# Patient Record
Sex: Male | Born: 1960 | Race: Black or African American | Hispanic: No | Marital: Married | Smoking: Never smoker
Health system: Southern US, Community
[De-identification: ages and names within clinical notes are randomized; demographics above are authoritative.]

## PROBLEM LIST (undated history)

## (undated) DIAGNOSIS — E1069 Type 1 diabetes mellitus with other specified complication: Secondary | ICD-10-CM

## (undated) DIAGNOSIS — E785 Hyperlipidemia, unspecified: Secondary | ICD-10-CM

## (undated) DIAGNOSIS — I6350 Cerebral infarction due to unspecified occlusion or stenosis of unspecified cerebral artery: Secondary | ICD-10-CM

## (undated) DIAGNOSIS — G4733 Obstructive sleep apnea (adult) (pediatric): Secondary | ICD-10-CM

## (undated) DIAGNOSIS — E119 Type 2 diabetes mellitus without complications: Secondary | ICD-10-CM

## (undated) DIAGNOSIS — D8689 Sarcoidosis of other sites: Secondary | ICD-10-CM

## (undated) DIAGNOSIS — F4321 Adjustment disorder with depressed mood: Secondary | ICD-10-CM

## (undated) DIAGNOSIS — I10 Essential (primary) hypertension: Secondary | ICD-10-CM

## (undated) DIAGNOSIS — I639 Cerebral infarction, unspecified: Secondary | ICD-10-CM

## (undated) DIAGNOSIS — L409 Psoriasis, unspecified: Secondary | ICD-10-CM

## (undated) DIAGNOSIS — Z86718 Personal history of other venous thrombosis and embolism: Secondary | ICD-10-CM

## (undated) DIAGNOSIS — D869 Sarcoidosis, unspecified: Secondary | ICD-10-CM

## (undated) HISTORY — DX: Obstructive sleep apnea (adult) (pediatric): G47.33

## (undated) HISTORY — DX: Cerebral infarction due to unspecified occlusion or stenosis of unspecified cerebral artery: I63.50

## (undated) HISTORY — DX: Personal history of other venous thrombosis and embolism: Z86.718

## (undated) HISTORY — DX: Psoriasis, unspecified: L40.9

---

## 2004-04-27 DIAGNOSIS — Z86718 Personal history of other venous thrombosis and embolism: Secondary | ICD-10-CM | POA: Insufficient documentation

## 2004-04-27 HISTORY — PX: VENTRICULAR ATRIAL SHUNT: SHX2657

## 2004-04-27 HISTORY — DX: Personal history of other venous thrombosis and embolism: Z86.718

## 2016-02-26 DIAGNOSIS — I639 Cerebral infarction, unspecified: Secondary | ICD-10-CM

## 2016-02-26 HISTORY — DX: Cerebral infarction, unspecified: I63.9

## 2017-07-13 DIAGNOSIS — E7801 Familial hypercholesterolemia: Secondary | ICD-10-CM | POA: Diagnosis not present

## 2017-07-13 DIAGNOSIS — E039 Hypothyroidism, unspecified: Secondary | ICD-10-CM | POA: Diagnosis not present

## 2017-07-13 DIAGNOSIS — E139 Other specified diabetes mellitus without complications: Secondary | ICD-10-CM | POA: Diagnosis not present

## 2017-07-13 DIAGNOSIS — Z0001 Encounter for general adult medical examination with abnormal findings: Secondary | ICD-10-CM | POA: Diagnosis not present

## 2017-07-13 DIAGNOSIS — I10 Essential (primary) hypertension: Secondary | ICD-10-CM | POA: Diagnosis not present

## 2018-03-31 ENCOUNTER — Inpatient Hospital Stay (HOSPITAL_COMMUNITY)
Admission: EM | Admit: 2018-03-31 | Discharge: 2018-04-02 | DRG: 065 | Disposition: A | Discharge status: Home / Self Care | Payer: Federal, State, Local not specified - PPO | Attending: Internal Medicine | Admitting: Internal Medicine

## 2018-03-31 ENCOUNTER — Emergency Department (HOSPITAL_COMMUNITY): Payer: Federal, State, Local not specified - PPO

## 2018-03-31 ENCOUNTER — Other Ambulatory Visit: Payer: Self-pay

## 2018-03-31 ENCOUNTER — Encounter (HOSPITAL_COMMUNITY): Payer: Self-pay

## 2018-03-31 DIAGNOSIS — R4781 Slurred speech: Secondary | ICD-10-CM | POA: Diagnosis present

## 2018-03-31 DIAGNOSIS — E1159 Type 2 diabetes mellitus with other circulatory complications: Secondary | ICD-10-CM | POA: Diagnosis present

## 2018-03-31 DIAGNOSIS — Z7982 Long term (current) use of aspirin: Secondary | ICD-10-CM

## 2018-03-31 DIAGNOSIS — D869 Sarcoidosis, unspecified: Secondary | ICD-10-CM | POA: Diagnosis not present

## 2018-03-31 DIAGNOSIS — I672 Cerebral atherosclerosis: Secondary | ICD-10-CM | POA: Diagnosis not present

## 2018-03-31 DIAGNOSIS — Z8673 Personal history of transient ischemic attack (TIA), and cerebral infarction without residual deficits: Secondary | ICD-10-CM | POA: Diagnosis present

## 2018-03-31 DIAGNOSIS — E108 Type 1 diabetes mellitus with unspecified complications: Secondary | ICD-10-CM | POA: Diagnosis present

## 2018-03-31 DIAGNOSIS — Z794 Long term (current) use of insulin: Secondary | ICD-10-CM | POA: Diagnosis not present

## 2018-03-31 DIAGNOSIS — I69351 Hemiplegia and hemiparesis following cerebral infarction affecting right dominant side: Secondary | ICD-10-CM

## 2018-03-31 DIAGNOSIS — R4701 Aphasia: Secondary | ICD-10-CM | POA: Diagnosis not present

## 2018-03-31 DIAGNOSIS — Z79899 Other long term (current) drug therapy: Secondary | ICD-10-CM

## 2018-03-31 DIAGNOSIS — D8689 Sarcoidosis of other sites: Secondary | ICD-10-CM | POA: Diagnosis not present

## 2018-03-31 DIAGNOSIS — I639 Cerebral infarction, unspecified: Secondary | ICD-10-CM

## 2018-03-31 DIAGNOSIS — R29810 Facial weakness: Secondary | ICD-10-CM | POA: Diagnosis not present

## 2018-03-31 DIAGNOSIS — E1069 Type 1 diabetes mellitus with other specified complication: Secondary | ICD-10-CM | POA: Diagnosis present

## 2018-03-31 DIAGNOSIS — I6330 Cerebral infarction due to thrombosis of unspecified cerebral artery: Secondary | ICD-10-CM | POA: Diagnosis not present

## 2018-03-31 DIAGNOSIS — I6381 Other cerebral infarction due to occlusion or stenosis of small artery: Secondary | ICD-10-CM | POA: Diagnosis not present

## 2018-03-31 DIAGNOSIS — I152 Hypertension secondary to endocrine disorders: Secondary | ICD-10-CM | POA: Diagnosis present

## 2018-03-31 DIAGNOSIS — F4321 Adjustment disorder with depressed mood: Secondary | ICD-10-CM | POA: Diagnosis not present

## 2018-03-31 DIAGNOSIS — Z8249 Family history of ischemic heart disease and other diseases of the circulatory system: Secondary | ICD-10-CM

## 2018-03-31 DIAGNOSIS — E785 Hyperlipidemia, unspecified: Secondary | ICD-10-CM | POA: Diagnosis present

## 2018-03-31 DIAGNOSIS — R471 Dysarthria and anarthria: Secondary | ICD-10-CM | POA: Diagnosis present

## 2018-03-31 DIAGNOSIS — R29702 NIHSS score 2: Secondary | ICD-10-CM | POA: Diagnosis not present

## 2018-03-31 DIAGNOSIS — Z982 Presence of cerebrospinal fluid drainage device: Secondary | ICD-10-CM | POA: Diagnosis not present

## 2018-03-31 DIAGNOSIS — E1051 Type 1 diabetes mellitus with diabetic peripheral angiopathy without gangrene: Secondary | ICD-10-CM | POA: Diagnosis present

## 2018-03-31 DIAGNOSIS — I6300 Cerebral infarction due to thrombosis of unspecified precerebral artery: Secondary | ICD-10-CM | POA: Diagnosis not present

## 2018-03-31 DIAGNOSIS — I10 Essential (primary) hypertension: Secondary | ICD-10-CM | POA: Diagnosis present

## 2018-03-31 DIAGNOSIS — I6389 Other cerebral infarction: Secondary | ICD-10-CM | POA: Diagnosis not present

## 2018-03-31 HISTORY — DX: Adjustment disorder with depressed mood: F43.21

## 2018-03-31 HISTORY — DX: Cerebral infarction, unspecified: I63.9

## 2018-03-31 HISTORY — DX: Hyperlipidemia, unspecified: E78.5

## 2018-03-31 HISTORY — DX: Sarcoidosis, unspecified: D86.9

## 2018-03-31 HISTORY — DX: Type 1 diabetes mellitus with other specified complication: E10.69

## 2018-03-31 HISTORY — DX: Essential (primary) hypertension: I10

## 2018-03-31 HISTORY — DX: Type 2 diabetes mellitus without complications: E11.9

## 2018-03-31 HISTORY — DX: Sarcoidosis of other sites: D86.89

## 2018-03-31 LAB — COMPREHENSIVE METABOLIC PANEL
ALT: 24 U/L (ref 0–44)
AST: 24 U/L (ref 15–41)
Albumin: 3.5 g/dL (ref 3.5–5.0)
Alkaline Phosphatase: 70 U/L (ref 38–126)
Anion gap: 9 (ref 5–15)
BUN: 21 mg/dL — AB (ref 6–20)
CO2: 25 mmol/L (ref 22–32)
Calcium: 9.2 mg/dL (ref 8.9–10.3)
Chloride: 107 mmol/L (ref 98–111)
Creatinine, Ser: 1.11 mg/dL (ref 0.61–1.24)
GFR calc Af Amer: >60 mL/min (ref >60)
GFR calc non Af Amer: >60 mL/min (ref >60)
Glucose, Bld: 77 mg/dL (ref 70–99)
Potassium: 4.1 mmol/L (ref 3.5–5.1)
Sodium: 141 mmol/L (ref 135–145)
Total Bilirubin: 0.3 mg/dL (ref 0.3–1.2)
Total Protein: 6.9 g/dL (ref 6.5–8.1)

## 2018-03-31 LAB — DIFFERENTIAL
ABS IMMATURE GRANULOCYTES: 0.01 10*3/uL (ref 0.00–0.07)
Basophils Absolute: 0 10*3/uL (ref 0.0–0.1)
Basophils Relative: 1 %
Eosinophils Absolute: 0.3 10*3/uL (ref 0.0–0.5)
Eosinophils Relative: 4 %
Immature Granulocytes: 0 %
Lymphocytes Relative: 37 %
Lymphs Abs: 2.4 10*3/uL (ref 0.7–4.0)
Monocytes Absolute: 0.6 10*3/uL (ref 0.1–1.0)
Monocytes Relative: 8 %
Neutro Abs: 3.4 10*3/uL (ref 1.7–7.7)
Neutrophils Relative %: 50 %

## 2018-03-31 LAB — I-STAT CHEM 8, ED
BUN: 23 mg/dL — ABNORMAL HIGH (ref 6–20)
CHLORIDE: 107 mmol/L (ref 98–111)
Calcium, Ion: 1.19 mmol/L (ref 1.15–1.40)
Creatinine, Ser: 1 mg/dL (ref 0.61–1.24)
Glucose, Bld: 69 mg/dL — ABNORMAL LOW (ref 70–99)
HCT: 43 % (ref 39.0–52.0)
Hemoglobin: 14.6 g/dL (ref 13.0–17.0)
Potassium: 4.1 mmol/L (ref 3.5–5.1)
Sodium: 141 mmol/L (ref 135–145)
TCO2: 27 mmol/L (ref 22–32)

## 2018-03-31 LAB — I-STAT TROPONIN, ED: Troponin i, poc: 0 ng/mL (ref 0.00–0.08)

## 2018-03-31 LAB — PROTIME-INR
INR: 1.18
PROTHROMBIN TIME: 14.9 s (ref 11.4–15.2)

## 2018-03-31 LAB — CBC
HCT: 43.1 % (ref 39.0–52.0)
Hemoglobin: 13.9 g/dL (ref 13.0–17.0)
MCH: 29.6 pg (ref 26.0–34.0)
MCHC: 32.3 g/dL (ref 30.0–36.0)
MCV: 91.7 fL (ref 80.0–100.0)
Platelets: 193 10*3/uL (ref 150–400)
RBC: 4.7 MIL/uL (ref 4.22–5.81)
RDW: 13.3 % (ref 11.5–15.5)
WBC: 6.7 10*3/uL (ref 4.0–10.5)
nRBC: 0 % (ref 0.0–0.2)

## 2018-03-31 LAB — APTT: aPTT: 25 seconds (ref 24–36)

## 2018-03-31 LAB — CBG MONITORING, ED
Glucose-Capillary: 57 mg/dL — ABNORMAL LOW (ref 70–99)
Glucose-Capillary: 154 mg/dL — ABNORMAL HIGH (ref 70–99)

## 2018-03-31 NOTE — ED Provider Notes (Signed)
[facility] EMERGENCY DEPARTMENT Provider Note   CSN: [id number] Arrival date & time: 03/31/18  1957     History   Chief Complaint Chief Complaint  Patient presents with  . Aphasia    since 03/30/18    HPI Daryn Hicks is a 57 y.o. male.  HPI  57 year old male with history of stroke and diabetes comes in with chief complaint of slurred speech. Patient has right-sided weakness as residual deficits from his stroke. He reports that yesterday he started having some difficulty with his fluency.  Today his family started noticing that he was slurring.  Patient was last normal yesterday around 3 PM.  Patient denies any new focal numbness, weakness, dizziness or vision change.  With his prior stroke he did not have any slurring. Review of system is negative for any UTI-like symptoms.  Patient also denies any recent fevers, chills, cough, flulike symptoms.  Past Medical History:  Diagnosis Date  . Diabetes mellitus without complication (HCC)   . Stroke [facility])     There are no active problems to display for this patient.   Past Surgical History:  Procedure Laterality Date  . VENTRICULAR ATRIAL SHUNT Right 2006   Procedure done at [facility] Medications    Prior to Admission medications   Medication Sig Start Date End Date Taking? Authorizing Provider  amLODipine (NORVASC) 10 MG tablet Take 10 mg by mouth daily. 01/20/18  Yes [provider]  aspirin EC 325 MG tablet Take 325 mg by mouth daily. 02/19/18  Yes [provider]  atenolol (TENORMIN) 100 MG tablet Take 100 mg by mouth daily.   Yes [provider]  atorvastatin (LIPITOR) 80 MG tablet Take 80 mg by mouth daily. 03/06/18  Yes [provider]  carvedilol (COREG) 3.125 MG tablet Take 3.125 mg by mouth 2 (two) times daily. 02/20/18  Yes [provider]  Cholecalciferol (VITAMIN D-3 PO) Take 1 capsule by mouth daily.   Yes  [provider]  Insulin Glargine (LANTUS SOLOSTAR) 100 UNIT/ML Solostar Pen Inject 7-8 Units into the skin every 12 (twelve) hours.    Yes [provider]  losartan (COZAAR) 100 MG tablet Take 100 mg by mouth daily.   Yes [provider]  multivitamin (ONE-A-DAY MEN'S) TABS tablet Take 1 tablet by mouth daily.   Yes [provider]  NOVOLOG FLEXPEN 100 UNIT/ML FlexPen Inject 8 Units into the skin 3 (three) times daily with meals. 03/22/18  Yes [provider]  sertraline (ZOLOFT) 100 MG tablet Take 100 mg by mouth daily.   Yes [provider]    Family History History reviewed. No pertinent family history.  Social History Social History   Tobacco Use  . Smoking status: Never Smoker  . Smokeless tobacco: Never Used  Substance Use Topics  . Alcohol use: Never    Frequency: Never  . Drug use: Never     Allergies   Patient has no known allergies.   Review of Systems Review of Systems  Constitutional: Positive for activity change. Negative for fever.  Gastrointestinal: Negative for nausea and vomiting.  Neurological: Positive for speech difficulty. Negative for dizziness, syncope, weakness and headaches.  Hematological: Does not bruise/bleed easily.  All other systems reviewed and are negative.    Physical Exam Updated Vital Signs BP (!) 153/82   Pulse 62   Temp 98.2 F (36.8 C) (Oral)   Resp 16   SpO2  99%   Physical Exam  Constitutional: He is oriented to person, place, and time. He appears well-developed.  HENT:  Head: Atraumatic.  Neck: Neck supple.  Cardiovascular: Normal rate.  Pulmonary/Chest: Effort normal.  Abdominal: Soft.  Neurological: He is alert and oriented to person, place, and time.  Patient has dysarthria  Cerebellar exam is normal (finger to nose) Sensory exam normal for bilateral upper and lower extremities - and patient is able to discriminate between sharp and dull. Motor exam is 4+/5  for bilateral upper and lower extremity, however right side does appear to be weaker compared to the contralateral side    Skin: Skin is warm.  Nursing note and vitals reviewed.    ED Treatments / Results  Labs (all labs ordered are listed, but only abnormal results are displayed) Labs Reviewed  COMPREHENSIVE METABOLIC PANEL - Abnormal; Notable for the following components:      Result Value   BUN 21 (*)    All other components within normal limits  CBG MONITORING, ED - Abnormal; Notable for the following components:   Glucose-Capillary 57 (*)    All other components within normal limits  I-STAT CHEM 8, ED - Abnormal; Notable for the following components:   BUN 23 (*)    Glucose, Bld 69 (*)    All other components within normal limits  PROTIME-INR  APTT  CBC  DIFFERENTIAL  I-STAT TROPONIN, ED  CBG MONITORING, ED    EKG EKG Interpretation  Date/Time:  Thursday March 31 2018 20:42:12 EST Ventricular Rate:  62 PR Interval:    QRS Duration: 80 QT Interval:  416 QTC Calculation: 423 R Axis:   131 Text Interpretation:  Right and left arm electrode reversal, interpretation assumes no reversal Sinus or ectopic atrial rhythm Probable lateral infarct, old No acute changes No old tracing to compare Confirmed by Derwood Kaplan [phone]) on 03/31/2018 9:48:43 PM   Radiology Ct Head Wo Contrast  Result Date: 03/31/2018 CLINICAL DATA:  Slurred speech beginning yesterday. History of stroke. EXAM: CT HEAD WITHOUT CONTRAST TECHNIQUE: Contiguous axial images were obtained from the base of the skull through the vertex without intravenous contrast. COMPARISON:  None. FINDINGS: Brain: Right parietal approach ventriculostomy catheter crosses midline into the expected body of the left lateral ventricle. No hydrocephalus. Chronic minimal small vessel ischemic disease of periventricular white matter. No intra-axial mass nor extra-axial fluid. Midline fourth ventricle basal cisterns. The  brainstem and cerebellum appear nonacute. Vascular: No hyperdense vessel sign. Skull: Negative for fracture or focal lesion. Sinuses/Orbits: No acute finding. Other: None. IMPRESSION: 1. Right parietal approach ventriculostomy catheter crosses midline into the body of the left lateral ventricle. No hydrocephalus. 2. Chronic minimal small vessel ischemic disease of periventricular white matter. 3. No acute intracranial abnormality. Electronically Signed   By: Tollie Eth M.D.   On: 03/31/2018 20:27    Procedures Procedures (including critical care time)  Medications Ordered in ED Medications - No data to display   Initial Impression / Assessment and Plan / ED Course  I have reviewed the triage vital signs and the nursing notes.  Pertinent labs & imaging results that were available during my care of the patient were reviewed by me and considered in my medical decision making (see chart for details).     57 year old male comes in with chief complaint of slurred speech.  He has history of diabetes and stroke. It appears that he was last normal yesterday afternoon.  On exam we do not have any focal  findings besides dysarthria.  Patient is well outside any treatment window.  I spoke with Dr. Laurence SlateAroor, neurology with concerns for subacute stroke.  CT scan of the brain is not showing any acute findings.  Dr. wants an MRI brain done, and for neurology to be consulted if patient has a stroke.  If MRI is negative then he would like patient to be discharged if there are no other medical concerns.  Our labs have not shown any significant metabolic abnormalities.  Final Clinical Impressions(s) / ED Diagnoses   Final diagnoses:  Dysarthria    ED Discharge Orders    None       Derwood KaplanNanavati, Icyss Skog, MD 03/31/18 2234

## 2018-03-31 NOTE — ED Triage Notes (Signed)
Pt here for slurred speech that began 03/30/18 in the early afternoon.  Slurred speech became progressively worse today. Pt has hx of stroke with right sided deficits.  Right arm and leg are weaker at baseline.  No other neuro deficits besides the slurred speech.  Hx of DM type 1. A&Ox4.

## 2018-03-31 NOTE — ED Notes (Signed)
Pt transported to MRI 

## 2018-04-01 ENCOUNTER — Encounter (HOSPITAL_COMMUNITY): Payer: Self-pay | Admitting: Internal Medicine

## 2018-04-01 ENCOUNTER — Other Ambulatory Visit (HOSPITAL_COMMUNITY): Payer: Federal, State, Local not specified - PPO

## 2018-04-01 ENCOUNTER — Inpatient Hospital Stay (HOSPITAL_COMMUNITY): Payer: Federal, State, Local not specified - PPO

## 2018-04-01 ENCOUNTER — Encounter (HOSPITAL_COMMUNITY): Payer: Federal, State, Local not specified - PPO

## 2018-04-01 DIAGNOSIS — I6389 Other cerebral infarction: Secondary | ICD-10-CM | POA: Diagnosis not present

## 2018-04-01 DIAGNOSIS — I63312 Cerebral infarction due to thrombosis of left middle cerebral artery: Secondary | ICD-10-CM

## 2018-04-01 DIAGNOSIS — Z794 Long term (current) use of insulin: Secondary | ICD-10-CM | POA: Diagnosis not present

## 2018-04-01 DIAGNOSIS — I6330 Cerebral infarction due to thrombosis of unspecified cerebral artery: Secondary | ICD-10-CM | POA: Diagnosis not present

## 2018-04-01 DIAGNOSIS — R471 Dysarthria and anarthria: Secondary | ICD-10-CM

## 2018-04-01 DIAGNOSIS — R4781 Slurred speech: Secondary | ICD-10-CM | POA: Diagnosis not present

## 2018-04-01 DIAGNOSIS — Z8673 Personal history of transient ischemic attack (TIA), and cerebral infarction without residual deficits: Secondary | ICD-10-CM

## 2018-04-01 DIAGNOSIS — I639 Cerebral infarction, unspecified: Secondary | ICD-10-CM | POA: Diagnosis present

## 2018-04-01 DIAGNOSIS — F4321 Adjustment disorder with depressed mood: Secondary | ICD-10-CM | POA: Diagnosis present

## 2018-04-01 DIAGNOSIS — E785 Hyperlipidemia, unspecified: Secondary | ICD-10-CM | POA: Diagnosis present

## 2018-04-01 DIAGNOSIS — D869 Sarcoidosis, unspecified: Secondary | ICD-10-CM | POA: Diagnosis not present

## 2018-04-01 DIAGNOSIS — R4701 Aphasia: Secondary | ICD-10-CM | POA: Diagnosis not present

## 2018-04-01 DIAGNOSIS — E1069 Type 1 diabetes mellitus with other specified complication: Secondary | ICD-10-CM | POA: Diagnosis present

## 2018-04-01 DIAGNOSIS — E1051 Type 1 diabetes mellitus with diabetic peripheral angiopathy without gangrene: Secondary | ICD-10-CM | POA: Diagnosis present

## 2018-04-01 DIAGNOSIS — Z8249 Family history of ischemic heart disease and other diseases of the circulatory system: Secondary | ICD-10-CM | POA: Diagnosis not present

## 2018-04-01 DIAGNOSIS — I6381 Other cerebral infarction due to occlusion or stenosis of small artery: Secondary | ICD-10-CM | POA: Diagnosis not present

## 2018-04-01 DIAGNOSIS — Z79899 Other long term (current) drug therapy: Secondary | ICD-10-CM | POA: Diagnosis not present

## 2018-04-01 DIAGNOSIS — I6621 Occlusion and stenosis of right posterior cerebral artery: Secondary | ICD-10-CM | POA: Diagnosis not present

## 2018-04-01 DIAGNOSIS — E108 Type 1 diabetes mellitus with unspecified complications: Secondary | ICD-10-CM | POA: Diagnosis present

## 2018-04-01 DIAGNOSIS — I10 Essential (primary) hypertension: Secondary | ICD-10-CM

## 2018-04-01 DIAGNOSIS — I152 Hypertension secondary to endocrine disorders: Secondary | ICD-10-CM | POA: Diagnosis present

## 2018-04-01 DIAGNOSIS — D8689 Sarcoidosis of other sites: Secondary | ICD-10-CM

## 2018-04-01 DIAGNOSIS — I69351 Hemiplegia and hemiparesis following cerebral infarction affecting right dominant side: Secondary | ICD-10-CM | POA: Diagnosis not present

## 2018-04-01 DIAGNOSIS — R29702 NIHSS score 2: Secondary | ICD-10-CM | POA: Diagnosis present

## 2018-04-01 DIAGNOSIS — Z982 Presence of cerebrospinal fluid drainage device: Secondary | ICD-10-CM | POA: Diagnosis not present

## 2018-04-01 DIAGNOSIS — I6300 Cerebral infarction due to thrombosis of unspecified precerebral artery: Secondary | ICD-10-CM | POA: Diagnosis not present

## 2018-04-01 DIAGNOSIS — R29810 Facial weakness: Secondary | ICD-10-CM | POA: Diagnosis present

## 2018-04-01 DIAGNOSIS — Z7982 Long term (current) use of aspirin: Secondary | ICD-10-CM | POA: Diagnosis not present

## 2018-04-01 DIAGNOSIS — E1159 Type 2 diabetes mellitus with other circulatory complications: Secondary | ICD-10-CM | POA: Diagnosis present

## 2018-04-01 DIAGNOSIS — I672 Cerebral atherosclerosis: Secondary | ICD-10-CM | POA: Diagnosis present

## 2018-04-01 HISTORY — DX: Type 1 diabetes mellitus with other specified complication: E10.69

## 2018-04-01 HISTORY — DX: Personal history of transient ischemic attack (TIA), and cerebral infarction without residual deficits: Z86.73

## 2018-04-01 HISTORY — DX: Adjustment disorder with depressed mood: F43.21

## 2018-04-01 HISTORY — DX: Sarcoidosis, unspecified: D86.9

## 2018-04-01 HISTORY — DX: Sarcoidosis of other sites: D86.89

## 2018-04-01 HISTORY — DX: Essential (primary) hypertension: I10

## 2018-04-01 LAB — URINALYSIS, COMPLETE (UACMP) WITH MICROSCOPIC
BACTERIA UA: NONE SEEN
Bilirubin Urine: NEGATIVE
Glucose, UA: NEGATIVE mg/dL
Hgb urine dipstick: NEGATIVE
Ketones, ur: NEGATIVE mg/dL
Leukocytes, UA: NEGATIVE
Nitrite: NEGATIVE
Protein, ur: NEGATIVE mg/dL
Specific Gravity, Urine: 1.008 (ref 1.005–1.030)
pH: 6 (ref 5.0–8.0)

## 2018-04-01 LAB — CBC
HCT: 41.4 % (ref 39.0–52.0)
Hemoglobin: 13.5 g/dL (ref 13.0–17.0)
MCH: 29.7 pg (ref 26.0–34.0)
MCHC: 32.6 g/dL (ref 30.0–36.0)
MCV: 91.2 fL (ref 80.0–100.0)
Platelets: 182 10*3/uL (ref 150–400)
RBC: 4.54 MIL/uL (ref 4.22–5.81)
RDW: 13.4 % (ref 11.5–15.5)
WBC: 6.3 10*3/uL (ref 4.0–10.5)
nRBC: 0 % (ref 0.0–0.2)

## 2018-04-01 LAB — RAPID URINE DRUG SCREEN, HOSP PERFORMED
Amphetamines: NOT DETECTED
Barbiturates: NOT DETECTED
Benzodiazepines: NOT DETECTED
Cocaine: NOT DETECTED
OPIATES: NOT DETECTED
Tetrahydrocannabinol: NOT DETECTED

## 2018-04-01 LAB — COMPREHENSIVE METABOLIC PANEL
ALT: 24 U/L (ref 0–44)
AST: 25 U/L (ref 15–41)
Albumin: 3.5 g/dL (ref 3.5–5.0)
Alkaline Phosphatase: 66 U/L (ref 38–126)
Anion gap: 8 (ref 5–15)
BUN: 16 mg/dL (ref 6–20)
CO2: 25 mmol/L (ref 22–32)
Calcium: 9.2 mg/dL (ref 8.9–10.3)
Chloride: 106 mmol/L (ref 98–111)
Creatinine, Ser: 1.09 mg/dL (ref 0.61–1.24)
GFR calc Af Amer: >60 mL/min (ref >60)
GFR calc non Af Amer: >60 mL/min (ref >60)
Glucose, Bld: 112 mg/dL — ABNORMAL HIGH (ref 70–99)
Potassium: 4 mmol/L (ref 3.5–5.1)
Sodium: 139 mmol/L (ref 135–145)
Total Bilirubin: 0.4 mg/dL (ref 0.3–1.2)
Total Protein: 6.7 g/dL (ref 6.5–8.1)

## 2018-04-01 LAB — LIPID PANEL
Cholesterol: 197 mg/dL (ref 0–200)
HDL: 86 mg/dL (ref >40)
LDL Cholesterol: 103 mg/dL — ABNORMAL HIGH (ref 0–99)
Total CHOL/HDL Ratio: 2.3 RATIO
Triglycerides: 38 mg/dL (ref <150)
VLDL: 8 mg/dL (ref 0–40)

## 2018-04-01 LAB — GLUCOSE, CAPILLARY
Glucose-Capillary: 209 mg/dL — ABNORMAL HIGH (ref 70–99)
Glucose-Capillary: 363 mg/dL — ABNORMAL HIGH (ref 70–99)
Glucose-Capillary: 322 mg/dL — ABNORMAL HIGH (ref 70–99)
Glucose-Capillary: 127 mg/dL — ABNORMAL HIGH (ref 70–99)
Glucose-Capillary: 260 mg/dL — ABNORMAL HIGH (ref 70–99)

## 2018-04-01 LAB — HEMOGLOBIN A1C
Hgb A1c MFr Bld: 6.4 % — ABNORMAL HIGH (ref 4.8–5.6)
Mean Plasma Glucose: 136.98 mg/dL

## 2018-04-01 LAB — HIV ANTIBODY (ROUTINE TESTING W REFLEX): HIV Screen 4th Generation wRfx: NONREACTIVE

## 2018-04-01 MED ORDER — ASPIRIN EC 81 MG PO TBEC
81.0000 mg | DELAYED_RELEASE_TABLET | Freq: Every day | ORAL | Status: DC
  Administered 2018-04-01 – 2018-04-02 (×2): 81 mg via ORAL
  Filled 2018-04-01 (×2): qty 1

## 2018-04-01 MED ORDER — LOSARTAN POTASSIUM 50 MG PO TABS
100.0000 mg | ORAL_TABLET | Freq: Every day | ORAL | Status: DC
  Administered 2018-04-02: 100 mg via ORAL
  Filled 2018-04-01: qty 2

## 2018-04-01 MED ORDER — INSULIN ASPART 100 UNIT/ML ~~LOC~~ SOLN
4.0000 [IU] | Freq: Once | SUBCUTANEOUS | Status: AC
  Administered 2018-04-01: 4 [IU] via SUBCUTANEOUS

## 2018-04-01 MED ORDER — INSULIN GLARGINE 100 UNIT/ML ~~LOC~~ SOLN
7.0000 [IU] | Freq: Every day | SUBCUTANEOUS | Status: DC
  Administered 2018-04-01: 7 [IU] via SUBCUTANEOUS
  Filled 2018-04-01 (×2): qty 0.07

## 2018-04-01 MED ORDER — ENOXAPARIN SODIUM 40 MG/0.4ML ~~LOC~~ SOLN
40.0000 mg | Freq: Every day | SUBCUTANEOUS | Status: DC
  Administered 2018-04-01 – 2018-04-02 (×2): 40 mg via SUBCUTANEOUS
  Filled 2018-04-01 (×2): qty 0.4

## 2018-04-01 MED ORDER — CLOPIDOGREL BISULFATE 75 MG PO TABS: 75.0000 mg | ORAL_TABLET | Freq: Every day | ORAL | Status: DC

## 2018-04-01 MED ORDER — SERTRALINE HCL 100 MG PO TABS
100.0000 mg | ORAL_TABLET | Freq: Every day | ORAL | Status: DC
  Administered 2018-04-01 – 2018-04-02 (×2): 100 mg via ORAL
  Filled 2018-04-01 (×2): qty 1

## 2018-04-01 MED ORDER — AMLODIPINE BESYLATE 10 MG PO TABS
10.0000 mg | ORAL_TABLET | Freq: Every day | ORAL | Status: DC
  Administered 2018-04-02: 10 mg via ORAL
  Filled 2018-04-01: qty 2

## 2018-04-01 MED ORDER — ONDANSETRON HCL 4 MG/2ML IJ SOLN: 4.0000 mg | Freq: Four times a day (QID) | INTRAMUSCULAR | Status: DC | PRN

## 2018-04-01 MED ORDER — ATORVASTATIN CALCIUM 80 MG PO TABS: 80.0000 mg | ORAL_TABLET | Freq: Every day | ORAL | Status: DC

## 2018-04-01 MED ORDER — ADULT MULTIVITAMIN W/MINERALS CH
1.0000 | ORAL_TABLET | Freq: Every day | ORAL | Status: DC
  Administered 2018-04-01 – 2018-04-02 (×2): 1 via ORAL
  Filled 2018-04-01 (×2): qty 1

## 2018-04-01 MED ORDER — STROKE: EARLY STAGES OF RECOVERY BOOK
Freq: Once | Status: AC
  Administered 2018-04-01: 10:00:00
  Filled 2018-04-01: qty 1

## 2018-04-01 MED ORDER — ASPIRIN EC 325 MG PO TBEC: 325.0000 mg | DELAYED_RELEASE_TABLET | Freq: Every day | ORAL | Status: DC

## 2018-04-01 MED ORDER — ACETAMINOPHEN 650 MG RE SUPP: 650.0000 mg | RECTAL | Status: DC | PRN

## 2018-04-01 MED ORDER — INSULIN ASPART 100 UNIT/ML ~~LOC~~ SOLN
0.0000 [IU] | Freq: Three times a day (TID) | SUBCUTANEOUS | Status: DC
  Administered 2018-04-01: 11 [IU] via SUBCUTANEOUS
  Administered 2018-04-01 – 2018-04-02 (×2): 2 [IU] via SUBCUTANEOUS
  Administered 2018-04-02: 3 [IU] via SUBCUTANEOUS

## 2018-04-01 MED ORDER — SODIUM CHLORIDE 0.9 % IV SOLN
INTRAVENOUS | Status: AC
  Administered 2018-04-01 (×2): via INTRAVENOUS

## 2018-04-01 MED ORDER — ACETAMINOPHEN 160 MG/5ML PO SOLN: 650.0000 mg | ORAL | Status: DC | PRN

## 2018-04-01 MED ORDER — INSULIN ASPART 100 UNIT/ML ~~LOC~~ SOLN
2.0000 [IU] | Freq: Once | SUBCUTANEOUS | Status: AC
  Administered 2018-04-02: 2 [IU] via SUBCUTANEOUS

## 2018-04-01 MED ORDER — SENNOSIDES-DOCUSATE SODIUM 8.6-50 MG PO TABS: 1.0000 | ORAL_TABLET | Freq: Every evening | ORAL | Status: DC | PRN

## 2018-04-01 MED ORDER — STROKE: EARLY STAGES OF RECOVERY BOOK
Freq: Once | Status: AC
  Administered 2018-04-01: 05:00:00
  Filled 2018-04-01 (×2): qty 1

## 2018-04-01 MED ORDER — CARVEDILOL 3.125 MG PO TABS
3.1250 mg | ORAL_TABLET | Freq: Two times a day (BID) | ORAL | Status: DC
  Administered 2018-04-01 – 2018-04-02 (×3): 3.125 mg via ORAL
  Filled 2018-04-01 (×5): qty 1

## 2018-04-01 MED ORDER — ATORVASTATIN CALCIUM 80 MG PO TABS
80.0000 mg | ORAL_TABLET | Freq: Every day | ORAL | Status: DC
  Filled 2018-04-01: qty 1

## 2018-04-01 MED ORDER — ACETAMINOPHEN 325 MG PO TABS: 650.0000 mg | ORAL_TABLET | Freq: Four times a day (QID) | ORAL | Status: DC | PRN

## 2018-04-01 MED ORDER — ACETAMINOPHEN 325 MG PO TABS: 650.0000 mg | ORAL_TABLET | ORAL | Status: DC | PRN

## 2018-04-01 MED ORDER — CLOPIDOGREL BISULFATE 75 MG PO TABS
75.0000 mg | ORAL_TABLET | Freq: Every day | ORAL | Status: DC
  Administered 2018-04-01 – 2018-04-02 (×2): 75 mg via ORAL
  Filled 2018-04-01 (×2): qty 1

## 2018-04-01 MED ORDER — INSULIN GLARGINE 100 UNIT/ML ~~LOC~~ SOLN
8.0000 [IU] | Freq: Every morning | SUBCUTANEOUS | Status: DC
  Administered 2018-04-01 – 2018-04-02 (×2): 8 [IU] via SUBCUTANEOUS
  Filled 2018-04-01 (×2): qty 0.08

## 2018-04-01 MED ORDER — SODIUM CHLORIDE 0.9 % IV SOLN
INTRAVENOUS | Status: DC
  Administered 2018-04-01 (×2): via INTRAVENOUS

## 2018-04-01 NOTE — Evaluation (Signed)
Physical Therapy Evaluation Patient Details Name: Normand SloopWalter Stash MRN: [id number] DOB: Nov 24, 1960 Today's Date: 04/01/2018   History of Present Illness  Pt is a 57 y/o male admitted secondary to slurred speech. MRI revealed 2 cm acute ischemic nonhemorrhagic infarct involving the periventricular of the left centrum semi ovale. PMH including but not limited to HTN, DM type I, sarcoidosis and previous CVA with R sided deficits.    Clinical Impression  Pt presented supine in bed with HOB elevated, awake and willing to participate in therapy session. Prior to admission, pt reported that he was independent with all functional mobility and ADLs. Pt lives with his spouse who is available 24/7 to provide supervision/assistance if needed. Pt currently requires supervision for bed mobility, min guard to min A for transfers and min guard to ambulate. Pt demonstrates gait abnormalities at baseline that cause him to have controlled instability. Pt with no overt LOB or need for physical assistance, min guard for safety. Pt would continue to benefit from skilled physical therapy services at this time while admitted and after d/c to address the below listed limitations in order to improve overall safety and independence with functional mobility.     Follow Up Recommendations Supervision/Assistance - 24 hour;No PT follow up    Equipment Recommendations  None recommended by PT    Recommendations for Other Services       Precautions / Restrictions Precautions Precautions: None Restrictions Weight Bearing Restrictions: No      Mobility  Bed Mobility Overal bed mobility: Needs Assistance Bed Mobility: Supine to Sit     Supine to sit: Supervision     General bed mobility comments: supervision for safety, increased time and effort, use of L UE on bed rail  Transfers Overall transfer level: Needs assistance Equipment used: None Transfers: Sit to/from Stand Sit to Stand: Min assist;Min guard          General transfer comment: increased time and effort, multiple attempts initially to stand from EOB with use of momentum, then min A was provided to achieve full standing; pt performed a second time from EOB with min guard for safety  Ambulation/Gait Ambulation/Gait assistance: Min guard Gait Distance (Feet): 60 Feet(20' x3; standing rest break to void, sitting rest break) Assistive device: None Gait Pattern/deviations: Step-to pattern;Decreased step length - right;Decreased step length - left;Decreased stance time - left;Decreased weight shift to right;Decreased stride length Gait velocity: decreased   General Gait Details: pt declining use of R AFO for ambulation within room to and from bathroom. Pt with frequent genu recurvatum of R knee with stance phase. Pt also with R hip hike and L weight shift to compensate during swing phase of R LE. Pt with noted gait abnormalities but controlled and reporting is his baseline  Information systems managertairs            Wheelchair Mobility    Modified Rankin (Stroke Patients Only) Modified Rankin (Stroke Patients Only) Pre-Morbid Rankin Score: Slight disability Modified Rankin: Moderately severe disability     Balance Overall balance assessment: Needs assistance Sitting-balance support: Feet supported Sitting balance-Leahy Scale: Good     Standing balance support: No upper extremity supported Standing balance-Leahy Scale: Fair                               Pertinent Vitals/Pain Pain Assessment: No/denies pain    Home Living Family/patient expects to be discharged to:: Private residence Living Arrangements: Spouse/significant other Available Help at  Discharge: Family;Available 24 hours/day Type of Home: House Home Access: Stairs to enter   [employer] of Steps: "a few steps but I am fine!"   Home Equipment: Other (comment);Cane - single point(L AFO) Additional Comments: pt continuously stating "I am fine" when asked  questions regarding home set up and PLOF    Prior Function Level of Independence: Independent         Comments: R AFO     Hand Dominance   Dominant Hand: Left(secondary to prior cva with R sided weakness)    Extremity/Trunk Assessment   Upper Extremity Assessment Upper Extremity Assessment: Defer to OT evaluation;RUE deficits/detail RUE Deficits / Details: pt with spasticity and flexor tone from previous cva; pt reported this is his baseline    Lower Extremity Assessment Lower Extremity Assessment: RLE deficits/detail RLE Deficits / Details: weakness throughout with compensatory strategies used when attempting MMT; pt able to flex hip, flex and extend knee against gravity; no active ankle DF/PF; sensation grossly intact as compared to L       Communication   Communication: Other (comment)(speech difficulties, slurring words)  Cognition Arousal/Alertness: Awake/alert Behavior During Therapy: Flat affect Overall Cognitive Status: Within Functional Limits for tasks assessed                                 General Comments: cognition not formally assessed but [facility] for general conversation      General Comments      Exercises     Assessment/Plan    PT Assessment Patient needs continued PT services  PT Problem List Decreased strength;Decreased range of motion;Decreased activity tolerance;Decreased balance;Decreased mobility;Decreased coordination       PT Treatment Interventions DME instruction;Functional mobility training;Gait training;Stair training;Therapeutic activities;Therapeutic exercise;Neuromuscular re-education;Balance training;Patient/family education    PT Goals (Current goals can be found in the Care Plan section)  Acute Rehab PT Goals Patient Stated Goal: return home  PT Goal Formulation: With patient Time For Goal Achievement: 04/15/18 Potential to Achieve Goals: Good    Frequency Min 4X/week   Barriers to discharge         Co-evaluation               AM-PAC PT "6 Clicks" Mobility  Outcome Measure Help needed turning from your back to your side while in a flat bed without using bedrails?: None Help needed moving from lying on your back to sitting on the side of a flat bed without using bedrails?: None Help needed moving to and from a bed to a chair (including a wheelchair)?: A Little Help needed standing up from a chair using your arms (e.g., wheelchair or bedside chair)?: None Help needed to walk in hospital room?: A Little Help needed climbing 3-5 steps with a railing? : A Little 6 Click Score: 21    End of Session Equipment Utilized During Treatment: Gait belt Activity Tolerance: Patient tolerated treatment well Patient left: in chair;with call bell/phone within reach;with chair alarm set Nurse Communication: Mobility status PT Visit Diagnosis: Other abnormalities of gait and mobility (R26.89);Other symptoms and signs involving the nervous system (R29.898)    Time: 4098-1191 PT Time Calculation (min) (ACUTE ONLY): 19 min   Charges:   PT Evaluation $PT Eval Moderate Complexity: 1 Mod          Deborah Chalk, PT, DPT  Acute Rehabilitation Services Pager [phone] Office [phone]    Alessandra Bevels Darrol Brandenburg 04/01/2018, 11:59 AM

## 2018-04-01 NOTE — Progress Notes (Signed)
OT Cancellation Note  Patient Details Name: Nicholas Hoover MRN: [id number] DOB: [birth year]   Cancelled Treatment:    Reason Eval/Treat Not Completed: Other (comment). Neurologist just walking into room to talk with patient.  Ignacia Palmaathy Ellia Knowlton, OTR/L Acute Rehab Services Pager [phone] Office [phone]     Evette GeorgesLeonard, Nicholas Hoover 04/01/2018, 2:43 PM

## 2018-04-01 NOTE — H&P (Signed)
History and Physical    [name] ZOX:[id number] DOB: 20-Jan-1961 DOA: 03/31/2018  PCP: Patient, No Pcp Per  Patient coming from: Home  I have personally briefly reviewed patient's old medical records in [facility]  Chief Complaint: Slurred speech  HPI: Nicholas Hoover is a 57 y.o. 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  Patient reported that he been having some difficulty with his fluency with slurred speech 1 day prior to presentation to the ED.  Patient denies any visual changes.  No increased weakness from right residual weakness, no asymmetric weakness or numbness, no new gait abnormalities.  Patient denies any fevers, no chills, no nausea, no vomiting, no chest pain, no shortness of breath, no abdominal pain, no constipation, no diarrhea, no dysuria, no syncope, no melena, no hematemesis, no hematochezia.  Patient states has been compliant with his medications.  ED Course: Patient seen in the ED, CBC done was unremarkable.  Comprehensive metabolic profile done was unremarkable.  Fasting lipid done with a LDL of 103.  CT head done showed right parietal approach ventriculostomy catheter crosses midline into the body of the left lateral ventricle.  No hydrocephalus.  Chronic minimal small vessel ischemic disease of periventricular white matter.  No acute intracranial abnormality.  MRI head which was done had a 2 cm acute ischemic nonhemorrhagic infarct involving the periventricular of the left centrum semiovale.  No other acute intracranial abnormality.  Right frontal approach ventriculostomy in place.  No hydrocephalus.  Underlying mild to moderate chronic microvascular ischemic disease.  MRA of the head showed  intracranial atherosclerosis.  No large vessel of left MCA branch occlusion.  No hemodynamically significant anterior circulation stenosis.  Moderate right PCA P2 segment stenosis.  EKG which was done with lead reversal.  Patient seen in consultation by neurology.   Review of Systems: As per HPI otherwise 10 point review of systems negative.   Past Medical History:  Diagnosis Date  . Diabetes mellitus without complication (HCC)   . HTN (hypertension) 04/01/2018  . Sarcoidosis 04/01/2018  . 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 Situational depression 04/01/2018  . Stroke (HCC)   . Type 1 diabetes mellitus with hyperlipidemia (HCC) 04/01/2018    Past Surgical History:  Procedure Laterality Date  . VENTRICULAR ATRIAL SHUNT Right 2006   Procedure done at [facility]     reports that he has never smoked. He has never used smokeless tobacco. He reports that he does not drink alcohol or use drugs.  No Known Allergies  Family History  Problem Relation Age of Onset  . Cancer Mother   . Heart attack Father    Mother deceased age 3 from cancer.  Father deceased age 75 from an acute MI.  Prior to Admission medications   Medication Sig Start Date End Date Taking? Authorizing Provider  amLODipine (NORVASC) 10 MG tablet Take 10 mg by mouth daily. 01/20/18  Yes [provider]  aspirin EC 325 MG tablet Take 325 mg by mouth daily. 02/19/18  Yes [provider]  atenolol (TENORMIN) 100 MG tablet Take 100 mg by mouth daily.   Yes [provider]  atorvastatin (LIPITOR) 80 MG tablet Take 80 mg by mouth daily. 03/06/18  Yes [provider]  carvedilol (COREG) 3.125 MG tablet Take 3.125 mg by mouth 2 (two) times daily. 02/20/18  Yes [provider]  Cholecalciferol (VITAMIN D-3 PO) Take 1 capsule by mouth daily.   Yes [provider]  Insulin Glargine (LANTUS SOLOSTAR) 100 UNIT/ML Solostar Pen Inject  7-8 Units into the skin every 12 (twelve) hours.    Yes [provider]  losartan (COZAAR) 100 MG tablet Take 100 mg by mouth daily.   Yes [provider]  multivitamin (ONE-A-DAY MEN'S) TABS tablet Take 1 tablet by mouth daily.   Yes [provider]  NOVOLOG FLEXPEN 100 UNIT/ML FlexPen Inject 8 Units into the skin 3 (three) times daily with meals. 03/22/18  Yes [provider]  sertraline (ZOLOFT) 100 MG tablet Take 100 mg by mouth daily.   Yes [provider]    Physical Exam: Vitals:   04/01/18 0100 04/01/18 0346 04/01/18 0415 04/01/18 0657  BP: 140/80 137/77 (!) 145/71 132/64  Pulse: 67 63 61 65  Resp:  16 16 17   Temp:  98 F (36.7 C) 98 F (36.7 C) 97.9 F (36.6 C)  TempSrc:  Oral Oral Oral  SpO2: 100% 99% 100% 100%  Weight:   112.5 kg   Height:   5\' 9"  (1.753 m)     Constitutional: NAD, calm, comfortable Vitals:   04/01/18 0100 04/01/18 0346 04/01/18 0415 04/01/18 0657  BP: 140/80 137/77 (!) 145/71 132/64  Pulse: 67 63 61 65  Resp:  16 16 17   Temp:  98 F (36.7 C) 98 F (36.7 C) 97.9 F (36.6 C)  TempSrc:  Oral Oral Oral  SpO2: 100% 99% 100% 100%  Weight:   112.5 kg   Height:   5\' 9"  (1.753 m)    Eyes: PERRLA, lids and conjunctivae normal ENMT: Mucous membranes are moist. Posterior pharynx clear of any exudate or lesions.Normal dentition.  Neck: normal, supple, no masses, no thyromegaly Respiratory: clear to auscultation bilaterally, no wheezing, no crackles. Normal respiratory effort. No accessory muscle use.  Cardiovascular: Regular rate and rhythm, no murmurs / rubs / gallops. No extremity edema. 2+ pedal pulses. No carotid bruits.  Abdomen: no tenderness, no masses palpated. No hepatosplenomegaly. Bowel sounds positive.  Musculoskeletal: no clubbing / cyanosis. No joint deformity upper and lower extremities. Good ROM, no contractures. Normal muscle tone.  Skin: no rashes, lesions, ulcers. No induration Neurologic:  Patient with dysarthric speech.  Otherwise the rest of the cranial nerves are grossly intact.  Sensation is intact.  Unable to elicit reflexes symmetrically and diffusely. 5/5 left upper and left lower extremity strength.  4/5 right upper and right lower extremity strength.  Gait not tested secondary to safety.  Psychiatric: Normal judgment and insight. Alert and oriented x 3. Normal mood.    Labs on Admission: I have personally reviewed following labs and imaging studies  CBC: Recent Labs  Lab 03/31/18 2012 03/31/18 2019  WBC 6.7  --   NEUTROABS 3.4  --   HGB 13.9 14.6  HCT 43.1 43.0  MCV 91.7  --   PLT 193  --    Basic Metabolic Panel: Recent Labs  Lab 03/31/18 2012 03/31/18 2019  NA 141 141  K 4.1 4.1  CL 107 107  CO2 25  --   GLUCOSE 77 69*  BUN 21* 23*  CREATININE 1.11 1.00  CALCIUM 9.2  --    GFR: Estimated Creatinine Clearance: 103.2 mL/min (by C-G formula based on SCr of 1  mg/dL). Liver Function Tests: Recent Labs  Lab 03/31/18 2012  AST 24  ALT 24  ALKPHOS 70  BILITOT 0.3  PROT 6.9  ALBUMIN 3.5   No results for input(s): LIPASE, AMYLASE in the last 168 hours. No results for input(s): AMMONIA in the last 168 hours. Coagulation Profile: Recent Labs  Lab 03/31/18 2012  INR 1.18   Cardiac Enzymes: No results for input(s): CKTOTAL, CKMB, CKMBINDEX, TROPONINI in the last 168 hours. BNP (last 3 results) No results for input(s): PROBNP in the last 8760 hours. HbA1C: Recent Labs    04/01/18 0336  HGBA1C 6.4*   CBG: Recent Labs  Lab 03/31/18 2032 03/31/18 2245  GLUCAP 57* 154*   Lipid Profile: Recent Labs    04/01/18 0336  CHOL 197  HDL 86  LDLCALC 103*  TRIG 38  CHOLHDL 2.3   Thyroid Function Tests: No results for input(s): TSH, T4TOTAL, FREET4, T3FREE, THYROIDAB in the last 72 hours. Anemia Panel: No results for input(s): VITAMINB12, FOLATE, FERRITIN, TIBC, IRON, RETICCTPCT in the last 72 hours. Urine analysis: No results found  for: COLORURINE, APPEARANCEUR, LABSPEC, PHURINE, GLUCOSEU, HGBUR, BILIRUBINUR, KETONESUR, PROTEINUR, UROBILINOGEN, NITRITE, LEUKOCYTESUR  Radiological Exams on Admission: Ct Head Wo Contrast  Result Date: 03/31/2018 CLINICAL DATA:  Slurred speech beginning yesterday. History of stroke. EXAM: CT HEAD WITHOUT CONTRAST TECHNIQUE: Contiguous axial images were obtained from the base of the skull through the vertex without intravenous contrast. COMPARISON:  None. FINDINGS: Brain: Right parietal approach ventriculostomy catheter crosses midline into the expected body of the left lateral ventricle. No hydrocephalus. Chronic minimal small vessel ischemic disease of periventricular white matter. No intra-axial mass nor extra-axial fluid. Midline fourth ventricle basal cisterns. The brainstem and cerebellum appear nonacute. Vascular: No hyperdense vessel sign. Skull: Negative for fracture or focal lesion. Sinuses/Orbits: No acute finding. Other: None. IMPRESSION: 1. Right parietal approach ventriculostomy catheter crosses midline into the body of the left lateral ventricle. No hydrocephalus. 2. Chronic minimal small vessel ischemic disease of periventricular white matter. 3. No acute intracranial abnormality. Electronically Signed   By: Tollie Eth M.D.   On: 03/31/2018 20:27   Mr Brain Wo Contrast  Result Date: 04/01/2018 CLINICAL DATA:  Initial evaluation for acute slurred speech. EXAM: MRI HEAD WITHOUT CONTRAST TECHNIQUE: Multiplanar, multiecho pulse sequences of the brain and surrounding structures were obtained without intravenous contrast. COMPARISON:  Prior head CT from earlier the same day. FINDINGS: Brain: Generalized age-related cerebral atrophy. Patchy and confluent T2/FLAIR hyperintensity within the periventricular deep white matter both cerebral hemispheres most consistent with chronic microvascular ischemic disease, mild to moderate nature. Encephalomalacia at the left ventral pons consistent with remote  ischemic infarct. Additional probable small remote lacunar infarcts at the posterior left internal capsule and right thalamus. No other encephalomalacia to suggest chronic cortical infarction. Few scattered chronic micro hemorrhages noted, predominantly clustered about the deep gray nuclei and brainstem, likely hypertensive in nature. Approximate 2 cm focus of restricted diffusion involving the periventricular white matter of the left centrum semi ovale, compatible with acute ischemic infarct (series 5, image 78). No associated hemorrhage or mass effect. No other evidence for acute or subacute ischemia. No acute intracranial hemorrhage. No mass lesion, midline shift or mass effect. Right frontal approach ventriculostomy in place with tip terminating near the foramen of Monro. Small amount of encephalomalacia and gliosis present along the catheter tract. Ventricles are decompressed without hydrocephalus. Pituitary gland within normal limits. Vascular: Major intracranial vascular flow voids are maintained. Skull and upper cervical spine: Craniocervical  junction within normal limits. Degenerative spondylolysis noted at C3-4 with resultant mild spinal stenosis. Upper cervical spine otherwise unremarkable. Bone marrow signal intensity within normal limits. No acute scalp soft tissue abnormality. Sinuses/Orbits: Globes and orbital soft tissues within normal limits. Paranasal sinuses are largely clear. No mastoid effusion. Inner ear structures normal. Other: None. IMPRESSION: 1. 2 cm acute ischemic nonhemorrhagic infarct involving the periventricular of the left centrum semi ovale. 2. No other acute intracranial abnormality. 3. Right frontal approach ventriculostomy in place. No hydrocephalus. 4. Underlying mild to moderate chronic microvascular ischemic disease. Electronically Signed   By: Rise Mu M.D.   On: 04/01/2018 00:21   Mr Shirlee Latch ZO Contrast  Result Date: 04/01/2018 CLINICAL DATA:  57 year old  male with small acute white matter infarct on MRI yesterday performed for slurred speech. Ventriculostomy. EXAM: MRA HEAD WITHOUT CONTRAST TECHNIQUE: Angiographic images of the Circle of Willis were obtained using MRA technique without intravenous contrast. COMPARISON:  Brain MRI and head CT 03/31/2018. FINDINGS: No intracranial mass effect. Stable visible ventriculostomy terminating near the 3rd ventricle to the left of midline. Antegrade flow in the posterior circulation with mildly tortuous codominant distal vertebral arteries. Patent PICA origins and vertebrobasilar junction. Patent basilar artery with irregularity but no significant stenosis. Patent AICA, SCA and PCA origins. Right posterior communicating artery is present, the left is diminutive or absent. Mild left PCA P2 segment stenosis. Mild to moderate left P3 branch irregularity. Moderate right P2 segment stenosis with mild-to-moderate right P3 branch irregularity. Antegrade flow in both ICA siphons. Mild siphon irregularity, no stenosis. Normal ophthalmic and right posterior communicating artery origins. Patent carotid termini. Normal MCA and ACA origins. Diminutive or absent anterior communicating artery. Visible ACA branches are within normal limits. Right MCA M1 segment is mildly irregular without stenosis. Right MCA trifurcation and visible right MCA branches are within normal limits. There is mild to moderate irregularity of the left M1 distally but mild if any distal M1 stenosis. The left MCA bifurcation is patent without stenosis. Visible left MCA branches are within normal limits. IMPRESSION: 1. Intracranial atherosclerosis. No large vessel or left MCA branch occlusion. 2. No hemodynamically significant anterior circulation stenosis. 3. Moderate right PCA P2 segment stenosis. Electronically Signed   By: Odessa Fleming M.D.   On: 04/01/2018 06:43    EKG: Independently reviewed.  Lead reversal.  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  Symptoms started 03/30/2018 with no significant improvement and had progressed.  Patient denies any worsening weakness in his residual right sided weakness.  No other focal neurological symptoms.  CT head which was done was unremarkable.  MRI of the brain which was done in the ED showed an acute left frontal periventricular white matter infarct.  Systolic blood pressures in the 140s to the 150s.  Patient states he has been compliant with his medications at home.  Patient was on aspirin 325 mg daily.  Will admit patient to telemetry.  Stroke work-up underway.  Patient has had a MRI MRA of the brain.  Check carotid Dopplers, check a 2D echo.  Fasting lipid panel with a LDL  of 103.  Goal LDL less than 70.  Hemoglobin A1c is 6.4.  Permissive hypertension.  PT/OT/ST we will hold patient's Norvasc and atenolol and Cozaar for now.  Continue home regimen Coreg.  Continue statin.  Patient has been seen in consultation by neurology who are recommending aspirin 81 mg daily and Plavix 75 mg daily for the next 3 weeks.  Neurology following and appreciate input and recommendations.  2.  Hypertension Permissive hypertension secondary to problem #1.  Will hold patient's Norvasc, atenolol, Cozaar and resume dose tomorrow..  Resume home regimen of Coreg.  Follow.  3.  Type 1 diabetes with hyperlipidemia Patient states has had type 1 diabetes for approximately 30 years.  Hemoglobin A1c 6.4.  Resume home regimen of Lantus 8 units in the morning and 7 units at night.  Place on a sliding scale insulin for now.  Follow.  4.   Hyperlipidemia LDL noted to be 103.  Goal LDL less than 70.  Continue statin.  5.  History of sarcoidosis with CNS involvement status post VP shunt Stable.  Outpatient follow-up.  6.  Situational depression Continue home regimen of Zoloft.  DVT prophylaxis: Lovenox Code Status: Full Family Communication: Updated patient.  No family at bedside. Disposition Plan: Likely home with home health versus SNF pending evaluation. Consults called: Neurology Admission status: Admit to inpatient/telemetry.   Ramiro Harvest MD [facility] Pager [phone]  If 7PM-7AM, please contact night-coverage [email or web address] Password [email or web address]  04/01/2018, 8:52 AM

## 2018-04-01 NOTE — Progress Notes (Signed)
STROKE TEAM PROGRESS NOTE   SUBJECTIVE (INTERVAL HISTORY) No family is at the bedside.  Overall his condition is stable. 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  This time new acute onset slurred speech, no worsening right-sided weakness.  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   OBJECTIVE Temp:  [97.8 F (36.6 C)-98.2 F (36.8 C)] 97.8 F (36.6 C) (12/06 0851) Pulse Rate:  [61-78] 78 (12/06 1152) Cardiac Rhythm: Normal sinus rhythm (12/06 0700) Resp:  [15-19] 18 (12/06 1152) BP: (126-153)/(64-107) 139/77 (12/06 1152) SpO2:  [97 %-100 %] 98 % (12/06 1152) Weight:  [112.5 kg] 112.5 kg (12/06 0415)  Recent Labs  Lab 03/31/18 2032 03/31/18 2245 04/01/18 0912  GLUCAP 57* 154* 209*   Recent Labs  Lab 03/31/18 2012 03/31/18 2019  NA 141 141  K 4.1 4.1  CL 107 107  CO2 25  --   GLUCOSE 77 69*  BUN 21* 23*  CREATININE 1.11 1.00  CALCIUM 9.2  --    Recent Labs  Lab 03/31/18 2012  AST 24  ALT 24  ALKPHOS 70  BILITOT 0.3  PROT 6.9  ALBUMIN 3.5   Recent Labs  Lab 03/31/18 2012 03/31/18 2019  WBC 6.7  --   NEUTROABS 3.4  --   HGB 13.9 14.6  HCT 43.1 43.0  MCV 91.7  --   PLT 193  --    No results for input(s): CKTOTAL, CKMB, CKMBINDEX, TROPONINI in the last 168 hours. Recent Labs    03/31/18 2012  LABPROT 14.9  INR 1.18   No results for input(s): COLORURINE, LABSPEC, PHURINE, GLUCOSEU, HGBUR, BILIRUBINUR, KETONESUR, PROTEINUR, UROBILINOGEN, NITRITE, LEUKOCYTESUR in the last 72 hours.  Invalid input(s): APPERANCEUR     Component Value Date/Time   CHOL 197 04/01/2018 0336   TRIG 38 04/01/2018 0336   HDL 86 04/01/2018 0336   CHOLHDL 2.3 04/01/2018 0336   VLDL 8 04/01/2018 0336   LDLCALC 103 (H) 04/01/2018 0336   Lab Results  Component Value Date   HGBA1C 6.4 (H) 04/01/2018   No results found for: LABOPIA, COCAINSCRNUR, LABBENZ,  AMPHETMU, THCU, LABBARB  No results for input(s): ETH in the last 168 hours.  I have personally reviewed the radiological images below and agree with the radiology interpretations.  Dg Chest 2 View  Result Date: 04/01/2018 CLINICAL DATA:  CVA yesterday. No current chest complaints. History of diabetes, hypertension, and sarcoidosis. Nonsmoker. EXAM: CHEST - 2 VIEW COMPARISON:  None in PACs FINDINGS: The lungs are adequately inflated. There is no focal infiltrate. There is no pleural effusion. The heart and pulmonary vascularity are normal. There is mild multilevel degenerative disc disease of the thoracic spine. The visualized portions of the ventriculoperitoneal shunt tube to the right of midline are unremarkable. IMPRESSION: There is no active cardiopulmonary disease. Electronically Signed   By: David  [place]and M.D.   On: 04/01/2018 10:34   Ct Head Wo Contrast  Result Date: 03/31/2018 CLINICAL DATA:  Slurred speech beginning yesterday. History of stroke. EXAM: CT HEAD WITHOUT CONTRAST TECHNIQUE: Contiguous axial images were obtained from the base of the skull through the vertex without intravenous contrast. COMPARISON:  None. FINDINGS: Brain: Right parietal approach ventriculostomy catheter crosses midline into the expected body of the left lateral ventricle. No hydrocephalus. Chronic minimal small vessel ischemic disease of periventricular white matter. No intra-axial mass nor extra-axial fluid. Midline fourth ventricle basal  cisterns. The brainstem and cerebellum appear nonacute. Vascular: No hyperdense vessel sign. Skull: Negative for fracture or focal lesion. Sinuses/Orbits: No acute finding. Other: None. IMPRESSION: 1. Right parietal approach ventriculostomy catheter crosses midline into the body of the left lateral ventricle. No hydrocephalus. 2. Chronic minimal small vessel ischemic disease of periventricular white matter. 3. No acute intracranial abnormality. Electronically Signed   By: Tollie Ethavid   Kwon M.D.   On: 03/31/2018 20:27   Mr Brain Wo Contrast  Result Date: 04/01/2018 CLINICAL DATA:  Initial evaluation for acute slurred speech. EXAM: MRI HEAD WITHOUT CONTRAST TECHNIQUE: Multiplanar, multiecho pulse sequences of the brain and surrounding structures were obtained without intravenous contrast. COMPARISON:  Prior head CT from earlier the same day. FINDINGS: Brain: Generalized age-related cerebral atrophy. Patchy and confluent T2/FLAIR hyperintensity within the periventricular deep white matter both cerebral hemispheres most consistent with chronic microvascular ischemic disease, mild to moderate nature. Encephalomalacia at the left ventral pons consistent with remote ischemic infarct. Additional probable small remote lacunar infarcts at the posterior left internal capsule and right thalamus. No other encephalomalacia to suggest chronic cortical infarction. Few scattered chronic micro hemorrhages noted, predominantly clustered about the deep gray nuclei and brainstem, likely hypertensive in nature. Approximate 2 cm focus of restricted diffusion involving the periventricular white matter of the left centrum semi ovale, compatible with acute ischemic infarct (series 5, image 78). No associated hemorrhage or mass effect. No other evidence for acute or subacute ischemia. No acute intracranial hemorrhage. No mass lesion, midline shift or mass effect. Right frontal approach ventriculostomy in place with tip terminating near the foramen of Monro. Small amount of encephalomalacia and gliosis present along the catheter tract. Ventricles are decompressed without hydrocephalus. Pituitary gland within normal limits. Vascular: Major intracranial vascular flow voids are maintained. Skull and upper cervical spine: Craniocervical junction within normal limits. Degenerative spondylolysis noted at C3-4 with resultant mild spinal stenosis. Upper cervical spine otherwise unremarkable. Bone marrow signal intensity within  normal limits. No acute scalp soft tissue abnormality. Sinuses/Orbits: Globes and orbital soft tissues within normal limits. Paranasal sinuses are largely clear. No mastoid effusion. Inner ear structures normal. Other: None. IMPRESSION: 1. 2 cm acute ischemic nonhemorrhagic infarct involving the periventricular of the left centrum semi ovale. 2. No other acute intracranial abnormality. 3. Right frontal approach ventriculostomy in place. No hydrocephalus. 4. Underlying mild to moderate chronic microvascular ischemic disease. Electronically Signed   By: Rise MuBenjamin  McClintock M.D.   On: 04/01/2018 00:21   Mr Shirlee LatchMra Head NWWo Contrast  Result Date: 04/01/2018 CLINICAL DATA:  57 year old male with small acute white matter infarct on MRI yesterday performed for slurred speech. Ventriculostomy. EXAM: MRA HEAD WITHOUT CONTRAST TECHNIQUE: Angiographic images of the Circle of Willis were obtained using MRA technique without intravenous contrast. COMPARISON:  Brain MRI and head CT 03/31/2018. FINDINGS: No intracranial mass effect. Stable visible ventriculostomy terminating near the 3rd ventricle to the left of midline. Antegrade flow in the posterior circulation with mildly tortuous codominant distal vertebral arteries. Patent PICA origins and vertebrobasilar junction. Patent basilar artery with irregularity but no significant stenosis. Patent AICA, SCA and PCA origins. Right posterior communicating artery is present, the left is diminutive or absent. Mild left PCA P2 segment stenosis. Mild to moderate left P3 branch irregularity. Moderate right P2 segment stenosis with mild-to-moderate right P3 branch irregularity. Antegrade flow in both ICA siphons. Mild siphon irregularity, no stenosis. Normal ophthalmic and right posterior communicating artery origins. Patent carotid termini. Normal MCA and ACA origins. Diminutive or absent anterior  communicating artery. Visible ACA branches are within normal limits. Right MCA M1 segment is  mildly irregular without stenosis. Right MCA trifurcation and visible right MCA branches are within normal limits. There is mild to moderate irregularity of the left M1 distally but mild if any distal M1 stenosis. The left MCA bifurcation is patent without stenosis. Visible left MCA branches are within normal limits. IMPRESSION: 1. Intracranial atherosclerosis. No large vessel or left MCA branch occlusion. 2. No hemodynamically significant anterior circulation stenosis. 3. Moderate right PCA P2 segment stenosis. Electronically Signed   By: Odessa Fleming M.D.   On: 04/01/2018 06:43    Carotid Doppler  Pending   TTE pending    PHYSICAL EXAM  Temp:  [97.8 F (36.6 C)-98.2 F (36.8 C)] 97.8 F (36.6 C) (12/06 0851) Pulse Rate:  [61-78] 78 (12/06 1152) Resp:  [15-19] 18 (12/06 1152) BP: (126-153)/(64-107) 139/77 (12/06 1152) SpO2:  [97 %-100 %] 98 % (12/06 1152) Weight:  [112.5 kg] 112.5 kg (12/06 0415)  General - Well nourished, well developed, in no apparent distress.  Ophthalmologic - fundi not visualized due to noncooperation.  Cardiovascular - Regular rate and rhythm.  Mental Status -  Level of arousal and orientation to time, place, and person were intact. Language including expression, naming, repetition, comprehension was assessed and found intact.  Mild dysarthria Fund of Knowledge was assessed and was intact.  Cranial Nerves II - XII - II - Visual field intact OU. III, IV, VI - Extraocular movements intact. V - Facial sensation intact bilaterally. VII - mild right facial droop. VIII - Hearing & vestibular intact bilaterally. X - Palate elevates symmetrically.  Mild dysarthria and bradyphonia XI - Chin turning & shoulder shrug intact bilaterally. XII - Tongue protrusion intact.  Motor Strength - The patient's strength was normal in left upper and lower extremities, but right upper extremity 3+/5 proximal and 2/5 distal, RLE 4/5 proximal and 3/5 distal.  Bulk was normal and  fasciculations were absent.   Motor Tone - Muscle tone was assessed at the neck and appendages and was increased on the right.  Reflexes - The patient's reflexes were increased on the right and he had no pathological reflexes.  Sensory - Light touch, temperature/pinprick were assessed and were symmetrical.    Coordination - The patient had normal movements in the left hand with no ataxia or dysmetria.  Tremor was absent.  Gait and Station - deferred.   ASSESSMENT/PLAN Mr. Nicholas Hoover is a 57 y.o. male with history of diabetes, hypertension, hyperlipidemia, stroke with right-sided weakness and neurosarcoidosis status post VP shunt admitted for slurred speech. No tPA given due to outside window.    Stroke:  left centrum semiovale periventricular infarct, likely small vessel disease source, given location and risk factors  Resultant mild dysarthria, baseline right-sided weakness  CT head no acute abnormality, right VP shunt  MRI left central semiovale periventricular infarct, chronic left pontine infarct  MRA diffuse intracranial atherosclerosis including mild to moderate bilaterally, bilateral PCAs and MCAs  Carotid Doppler pending  2D Echo pending  LDL 103  HgbA1c 6.4  Lovenox for VTE prophylaxis  aspirin 325 mg daily prior to admission, now on aspirin 81 mg daily and clopidogrel 75 mg daily.  Continue DAPT for 3 weeks and then Plavix alone  Patient counseled to be compliant with his antithrombotic medications  Ongoing aggressive stroke risk factor management  Therapy recommendations:  Pending   Disposition:  Pending   Hx of stroke  Patient stated that  it was 2 years ago  Residual right-sided weakness, baseline able to walk without device, but are not able to write or eat with right hand  MRI showed chronic left pontine infarct  On aspirin Lipitor at home  Diabetes  HgbA1c 6.4 goal < 7.0  Controlled  Home meds Lantus  CBG monitoring  SSI  close PCP  follow up  Hypertension . Stable . Permissive hypertension (OK if <220/120) for 24-48 hours post stroke and then gradually normalized within 3-5 days.  Long term BP goal normotensive  Hyperlipidemia  Home meds: Lipitor 80  LDL 103, goal < 70  Now on Lipitor 80  Continue statin at discharge  Other Stroke Risk Factors    Other Active Problems  Neurosarcoidosis status post VP shunt, in remission as per patient  Hospital day # 0   Marvel Plan, MD PhD Stroke Neurology 04/01/2018 1:25 PM    To contact Stroke Continuity provider, please refer to [email or web address]. After hours, contact General Neurology

## 2018-04-01 NOTE — Care Management Note (Signed)
Case Management Note  Patient Details  Name: Nicholas Hoover MRN: [id number] Date of Birth: [birth year]  Subjective/Objective:    Pt admitted with CVA. He is from home with spouse. Pt states they are currently staying in a hotel until get into new house on Monday. DME: brace, cane Pt denies issues obtaining his medications. Pt denies issues with transportation.                 Action/Plan: Pt new to area and without a PCP. CM was able to obtain [facility] an appointment [facility] [place] Information on the AVS.  Wife is able to provide 24 hour supervision at home.    Expected Discharge Date:                  Expected Discharge Plan:     In-House Referral:     Discharge planning Services     Post Acute Care Choice:    Choice offered to:     DME Arranged:    DME Agency:     HH Arranged:    HH Agency:     Status of Service:     If discussed at [employer] Meetings, dates discussed:    Additional Comments:  Kermit BaloKelli F Druanne Bosques, RN 04/01/2018, 11:15 AM

## 2018-04-01 NOTE — ED Provider Notes (Signed)
Pt found to have CVA D/w dr Katrinka Blazingsmith for admission Pt/wife updated on plan    Nicholas Hoover, Nicholas Newton, MD 04/01/18 [phone]

## 2018-04-01 NOTE — Consult Note (Signed)
Requesting Physician: Dr. Rhunette CroftNanavati    Chief Complaint: Slurred speech  History obtained from: Patient and Chart    HPI:                                                                                                                                       Nicholas Hoover is a 57 y.o. male with past medical history of CVA with residual right hemiparesis and spasticity, hypertension, diabetes mellitus presents to the emergency room with slurred speech.  Symptoms started on 12/4 afternoon and has progressively worsened.  No associated facial droop.  States his right side appears to be weaker than baseline but is still able to ambulate without support.    In the emergency department, MRI brain performed shows an acute left frontal periventricular white matter infarct.  Blood pressure 140- 150  Systolic range. Patient is compliant with ASA 325mg  at home.    Date last known well: 12.4.19 Time last known well: around 1pm tPA Given: no, outside window NIHSS: 2 Baseline MRS 2     Past Medical History:  Diagnosis Date  . Diabetes mellitus without complication (HCC)   . Stroke [facility]HCC)     Past Surgical History:  Procedure Laterality Date  . VENTRICULAR ATRIAL SHUNT Right 2006   [facility]    History reviewed. No pertinent family history. Social History:  reports that he has never smoked. He has never used smokeless tobacco. He reports that he does not drink alcohol or use drugs.  Allergies: No Known Allergies  Medications:                                                                                                                        I reviewed home medications   ROS:  14 systems reviewed and negative except above    Examination:                                                                                                       General: Appears well-developed  Psych: Affect appropriate to situation Eyes: No scleral injection HENT: No OP obstrucion Head: Normocephalic.  Cardiovascular: Normal rate and regular rhythm.  Respiratory: Effort normal and breath sounds normal to anterior ascultation GI: Soft.  No distension. There is no tenderness.  Skin: WDI    Neurological Examination Mental Status: Alert, oriented, thought content appropriate.  Speech slurred but  fluent without evidence of aphasia. Able to follow 3 step commands without difficulty. Cranial Nerves: II: Visual fields grossly normal,  III,IV, VI: ptosis not present, extra-ocular motions intact bilaterally, pupils equal, round, reactive to light and accommodation V,VII: smile symmetric, facial light touch sensation normal bilaterally VIII: hearing normal bilaterally IX,X: uvula rises symmetrically XI: bilateral shoulder shrug XII: midline tongue extension Motor: Right : Upper extremity   3+/5    Left:     Upper extremity   5/5  Lower extremity   4/5     Lower extremity   5/5 Tone and bulk: increased tone over right arm and leg with RUE spasticity  Sensory: Pinprick and light touch intact throughout, bilaterally Deep Tendon Reflexes: Brisk over left patella, Plantars: Right: downgoing   Left: downgoing Cerebellar: normal finger-to-nose, normal rapid alternating movements and normal heel-to-shin test Gait: normal gait and station     Lab Results: Basic Metabolic Panel: Recent Labs  Lab 03/31/18 2012 03/31/18 2019  NA 141 141  K 4.1 4.1  CL 107 107  CO2 25  --   GLUCOSE 77 69*  BUN 21* 23*  CREATININE 1.11 1.00  CALCIUM 9.2  --     CBC: Recent Labs  Lab 03/31/18 2012 03/31/18 2019  WBC 6.7  --   NEUTROABS 3.4  --   HGB 13.9 14.6  HCT 43.1 43.0  MCV 91.7  --   PLT 193  --     Coagulation Studies: Recent Labs    03/31/18 2010/10/02  LABPROT 14.9  INR 1.18    Imaging: Ct Head Wo Contrast  Result  Date: 03/31/2018 CLINICAL DATA:  Slurred speech beginning yesterday. History of stroke. EXAM: CT HEAD WITHOUT CONTRAST TECHNIQUE: Contiguous axial images were obtained from the base of the skull through the vertex without intravenous contrast. COMPARISON:  None. FINDINGS: Brain: Right parietal approach ventriculostomy catheter crosses midline into the expected body of the left lateral ventricle. No hydrocephalus. Chronic minimal small vessel ischemic disease of periventricular white matter. No intra-axial mass nor extra-axial fluid. Midline fourth ventricle basal cisterns. The brainstem and cerebellum appear nonacute. Vascular: No hyperdense vessel sign. Skull: Negative for fracture or focal lesion. Sinuses/Orbits: No acute finding. Other: None. IMPRESSION: 1. Right parietal approach ventriculostomy catheter crosses midline into the body of the left lateral ventricle. No hydrocephalus. 2. Chronic minimal small vessel ischemic disease of periventricular white matter. 3. No acute intracranial abnormality. Electronically Signed   By: Onalee Hua  Sterling Big M.D.   On: 03/31/2018 20:27     ASSESSMENT AND PLAN  57 y.o. male with past medical history of CVA with residual right hemiparesis and spasticity, hypertension, diabetes mellitus presents to the emergency room with slurred speech x 1 day.  MRI confirms subcortical infarct likely secondary to small vessel disease.  Likely from uncontrolled risk factors.    Acute Ischemic Stroke   Risk factors :HTN, DM, prior stroke Etiology: small vessel disease   Recommend # MRI of the brain without contrast ( completed) #MRA Head and neck and carotid doppler  #Transthoracic Echo  # Continue ASA 325mg  daily, add Plavix 75 daily x 3 weeks  #Start or continue Atorvastatin 40 mg/other high intensity statin # BP goal: permissive HTN upto 220/120 mmHg  # HBAIC and Lipid profile # Telemetry monitoring # Frequent neuro checks # NPO until passes stroke swallow screen  Please  page stroke NP  Or  PA  Or MD from 8am -4 pm  as this patient from this time will be  followed by the stroke.   You can look them up on [email or web address]  Password [facility]    Sushanth Aroor [facility] Pager Number [phone]

## 2018-04-02 ENCOUNTER — Inpatient Hospital Stay (HOSPITAL_COMMUNITY): Payer: Federal, State, Local not specified - PPO

## 2018-04-02 DIAGNOSIS — I6300 Cerebral infarction due to thrombosis of unspecified precerebral artery: Secondary | ICD-10-CM

## 2018-04-02 DIAGNOSIS — I6389 Other cerebral infarction: Secondary | ICD-10-CM

## 2018-04-02 LAB — URINE CULTURE: Culture: NO GROWTH

## 2018-04-02 LAB — BASIC METABOLIC PANEL
Anion gap: 10 (ref 5–15)
BUN: 17 mg/dL (ref 6–20)
CO2: 23 mmol/L (ref 22–32)
Calcium: 8.8 mg/dL — ABNORMAL LOW (ref 8.9–10.3)
Chloride: 107 mmol/L (ref 98–111)
Creatinine, Ser: 1.12 mg/dL (ref 0.61–1.24)
GFR calc Af Amer: >60 mL/min (ref >60)
GFR calc non Af Amer: >60 mL/min (ref >60)
Glucose, Bld: 175 mg/dL — ABNORMAL HIGH (ref 70–99)
Potassium: 4.1 mmol/L (ref 3.5–5.1)
Sodium: 140 mmol/L (ref 135–145)

## 2018-04-02 LAB — ECHOCARDIOGRAM COMPLETE
Height: 69 in
Weight: 3968.28 [oz_av]

## 2018-04-02 LAB — GLUCOSE, CAPILLARY
Glucose-Capillary: 147 mg/dL — ABNORMAL HIGH (ref 70–99)
Glucose-Capillary: 169 mg/dL — ABNORMAL HIGH (ref 70–99)
Glucose-Capillary: 134 mg/dL — ABNORMAL HIGH (ref 70–99)

## 2018-04-02 LAB — SYPHILIS: RPR W/REFLEX TO RPR TITER AND TREPONEMAL ANTIBODIES, TRADITIONAL SCREENING AND DIAGNOSIS ALGORITHM: RPR Ser Ql: NONREACTIVE

## 2018-04-02 MED ORDER — CLOPIDOGREL BISULFATE 75 MG PO TABS: 75.0000 mg | ORAL_TABLET | Freq: Every day | ORAL | 0 refills | Status: DC

## 2018-04-02 MED ORDER — ASPIRIN 81 MG PO TBEC: 81.0000 mg | DELAYED_RELEASE_TABLET | Freq: Every day | ORAL | 0 refills | Status: DC

## 2018-04-02 NOTE — Discharge Summary (Signed)
Physician Discharge Summary  Nicholas Hoover JXB:[id number] DOB: May 26, 1960 DOA: 03/31/2018  PCP: Patient, No Pcp Per  Admit date: 03/31/2018 Discharge date: 04/02/2018  Admitted From: Home Disposition:  Home  Discharge Condition:Stable CODE STATUS:FULL Diet recommendation: Heart Healthy  Brief/Interim Summary:  Admission H and P:  HPI: Nicholas Hoover is a 57 y.o. 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  Patient reported that he been having some difficulty with his fluency with slurred speech 1 day prior to presentation to the ED.  Patient denies any visual changes.  No increased weakness from right residual weakness, no asymmetric weakness or numbness, no new gait abnormalities.  Patient denies any fevers, no chills, no nausea, no vomiting, no chest pain, no shortness of breath, no abdominal pain, no constipation, no diarrhea, no dysuria, no syncope, no melena, no hematemesis, no hematochezia.  Patient states has been compliant with his medications.  ED Course: Patient seen in the ED, CBC done was unremarkable.  Comprehensive metabolic profile done was unremarkable.  Fasting lipid done with a LDL of 103.  CT head done showed right parietal approach ventriculostomy catheter crosses midline into the body of the left lateral ventricle.  No hydrocephalus.  Chronic minimal small vessel ischemic disease of periventricular white matter.  No acute intracranial abnormality.  MRI head which was done had a 2 cm acute ischemic nonhemorrhagic infarct involving the periventricular of the left centrum semiovale.  No other acute intracranial abnormality.  Right frontal approach ventriculostomy in place.  No hydrocephalus.  Underlying mild  to moderate chronic microvascular ischemic disease.  MRA of the head showed intracranial atherosclerosis.  No large vessel of left MCA branch occlusion.  No hemodynamically significant anterior circulation stenosis.  Moderate right PCA P2 segment stenosis.  EKG which was done with lead reversal.  Patient seen in consultation by neurology.  Hospital Course: Patient's hospital his course remained stable.  He was seen by neurology after admission.  MRI finding as above.  He was on aspirin at home.  He will continue aspirin  81 mg and Plavix for total of 21 days and then continue on Plavix only.  Continue Lipitor.  Echocardiogram showed normal left ventricular function, no wall motion abnormality. Carotid Doppler did not show any significant carotid stenosis. He was seen by physical therapy , no issues with recommendation.  Speech therapy evaluated him and recommended to follow-up as an outpatient with the speech. He is hemodynamically stable for discharge today.  Neurology cleared him for discharge as well.  Following problems were addressed during his hospitalization:  #1 acute CVA Patient with prior history of CVA with residual right hemiparesis and spasticity presenting to the ED with a 1 day history of slurred/dysarthric speech.  Symptoms started 03/30/2018 with no significant improvement and had progressed.  Patient denies any worsening weakness in his residual right sided weakness.  No other focal neurological symptoms.  CT head which was done was unremarkable.  MRI of the brain which was done in the ED showed an acute left frontal periventricular white matter infarct.  Fasting lipid panel with a LDL of 103.  Goal LDL less than 70.  Hemoglobin A1c is 6.4.    Continue statin.  Patient has been seen in consultation by neurology who are recommending  aspirin 81 mg daily and Plavix 75 mg daily for the next 3 weeks.   2.  Hypertension   Resume home regimen .  3.  Type 1 diabetes with  hyperlipidemia Patient states has had type 1 diabetes for approximately 30 years.  Hemoglobin A1c 6.4.  Resume home regimen of Lantus 8 units in the morning and 7 units at night.   4.  Hyperlipidemia LDL noted to be 103.  Goal LDL less than 70.  Continue statin.  5.  History of sarcoidosis with CNS involvement status post VP shunt Stable.  Outpatient follow-up.  6.  Situational depression Continue home regimen of Zoloft.     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 3) Take aspirin 81 mg daily for 19 days from tomorrow.  Continue Plavix indefinitely. 4) Follow up with the speech therapist as an outpatient.   Increase activity slowly   Complete by:  As directed      Allergies as of 04/02/2018   No Known Allergies     Medication List    STOP taking these medications   atenolol 100 MG tablet Commonly known as:  TENORMIN     TAKE these medications   amLODipine 10 MG tablet Commonly known as:  NORVASC Take 10 mg by mouth daily.   aspirin 81 MG EC tablet Take 1 tablet (81 mg total) by mouth daily. Start taking on:  04/03/2018 What changed:    medication strength  how much to take   atorvastatin 80 MG tablet Commonly known as:  LIPITOR Take 80 mg by mouth daily.   carvedilol  3.125 MG tablet Commonly known as:  COREG Take 3.125 mg by mouth 2 (two) times daily.   clopidogrel 75 MG tablet Commonly known as:  PLAVIX Take 1 tablet (75 mg total) by mouth daily. Start taking on:  04/03/2018   LANTUS SOLOSTAR 100 UNIT/ML Solostar Pen Generic drug:  Insulin Glargine Inject 7-8 Units into the skin every 12 (twelve) hours.   losartan 100 MG tablet Commonly known as:  COZAAR Take 100 mg by mouth daily.   multivitamin Tabs tablet Take 1 tablet by mouth daily.   NOVOLOG FLEXPEN 100 UNIT/ML FlexPen Generic drug:  insulin aspart Inject 8 Units into the skin 3 (three) times daily with meals.   sertraline 100 MG tablet Commonly known as:  ZOLOFT Take 100 mg by mouth daily.   VITAMIN D-3 PO Take 1 capsule by mouth daily.      Follow-up Information    [facility] Follow up on 04/06/2018.   Why:  Your appt is at 3 pm. please arrive 20 min early. you may come by early next week and pick up the new patient  package to fill out before your appt is able.  please bring a picture ID, insurance card and medications Contact information: [address], [address] [place], [place] [address]   [phone]        [facility] Follow up.   Specialty:  Rehabilitation Why:  For outpatient speech therapy. They will call you this week to schedule Contact information: [address] [id number] mc [place] [address] [phone]         No Known Allergies  Consultations:  Neurology   Procedures/Studies: Dg Chest 2 View  Result Date: 04/01/2018 CLINICAL DATA:  CVA yesterday. No current chest complaints. History of diabetes, hypertension, and sarcoidosis. Nonsmoker. EXAM: CHEST - 2 VIEW COMPARISON:  None in PACs FINDINGS: The lungs are adequately inflated. There is no focal infiltrate. There is no pleural effusion. The heart and pulmonary vascularity are normal. There is mild  multilevel degenerative disc disease of the thoracic spine. The visualized portions of the ventriculoperitoneal shunt tube to the right of midline are unremarkable. IMPRESSION: There is no active cardiopulmonary disease. Electronically Signed   By: David  [place] M.D.   On: 04/01/2018 10:34   Ct Head Wo Contrast  Result Date: 03/31/2018 CLINICAL DATA:  Slurred speech beginning yesterday. History of stroke. EXAM: CT HEAD WITHOUT CONTRAST TECHNIQUE: Contiguous axial images were obtained from the base of the skull through the vertex without intravenous contrast. COMPARISON:  None. FINDINGS: Brain: Right parietal approach ventriculostomy catheter crosses midline into the expected body of the left lateral ventricle. No hydrocephalus. Chronic minimal small vessel ischemic disease of periventricular white matter. No intra-axial mass nor extra-axial fluid. Midline fourth ventricle basal cisterns. The brainstem and cerebellum appear nonacute. Vascular: No hyperdense vessel sign. Skull: Negative for fracture or focal lesion. Sinuses/Orbits: No acute finding. Other: None. IMPRESSION: 1. Right parietal approach ventriculostomy catheter crosses midline into the body of the left lateral ventricle. No hydrocephalus. 2. Chronic minimal small vessel ischemic disease of periventricular white matter. 3. No acute intracranial abnormality. Electronically Signed   By: Tollie Eth M.D.   On: 03/31/2018 20:27   Mr Brain Wo Contrast  Result Date: 04/01/2018 CLINICAL DATA:  Initial evaluation for acute slurred speech. EXAM: MRI HEAD WITHOUT CONTRAST TECHNIQUE: Multiplanar, multiecho pulse sequences of the brain and surrounding structures were obtained without intravenous contrast. COMPARISON:  Prior head CT from earlier the same day. FINDINGS: Brain: Generalized age-related cerebral atrophy. Patchy and confluent T2/FLAIR hyperintensity within the periventricular deep white matter both cerebral hemispheres most consistent with chronic  microvascular ischemic disease, mild to moderate nature. Encephalomalacia at the left ventral pons consistent with remote ischemic infarct. Additional probable small remote lacunar infarcts at the posterior left internal capsule and right thalamus. No other encephalomalacia to suggest chronic cortical infarction. Few scattered chronic micro hemorrhages noted, predominantly clustered about the deep gray nuclei and brainstem, likely hypertensive in nature. Approximate 2 cm focus of restricted diffusion involving the periventricular white matter of the left centrum semi ovale, compatible with acute ischemic infarct (series 5, image 78). No associated hemorrhage or mass effect. No other evidence for acute or subacute ischemia. No acute intracranial hemorrhage. No mass lesion, midline shift or mass effect. Right frontal approach ventriculostomy in place with tip terminating near the foramen of Monro. Small amount of encephalomalacia and gliosis present along the catheter tract. Ventricles are decompressed without hydrocephalus. Pituitary gland within normal limits. Vascular: Major intracranial vascular flow voids are maintained. Skull and upper cervical spine: Craniocervical junction  within normal limits. Degenerative spondylolysis noted at C3-4 with resultant mild spinal stenosis. Upper cervical spine otherwise unremarkable. Bone marrow signal intensity within normal limits. No acute scalp soft tissue abnormality. Sinuses/Orbits: Globes and orbital soft tissues within normal limits. Paranasal sinuses are largely clear. No mastoid effusion. Inner ear structures normal. Other: None. IMPRESSION: 1. 2 cm acute ischemic nonhemorrhagic infarct involving the periventricular of the left centrum semi ovale. 2. No other acute intracranial abnormality. 3. Right frontal approach ventriculostomy in place. No hydrocephalus. 4. Underlying mild to moderate chronic microvascular ischemic disease. Electronically Signed   By: Rise MuBenjamin   McClintock M.D.   On: 04/01/2018 00:21   Mr Shirlee LatchMra Head ZOWo Contrast  Result Date: 04/01/2018 CLINICAL DATA:  57 year old male with small acute white matter infarct on MRI yesterday performed for slurred speech. Ventriculostomy. EXAM: MRA HEAD WITHOUT CONTRAST TECHNIQUE: Angiographic images of the Circle of Willis were obtained using MRA technique without intravenous contrast. COMPARISON:  Brain MRI and head CT 03/31/2018. FINDINGS: No intracranial mass effect. Stable visible ventriculostomy terminating near the 3rd ventricle to the left of midline. Antegrade flow in the posterior circulation with mildly tortuous codominant distal vertebral arteries. Patent PICA origins and vertebrobasilar junction. Patent basilar artery with irregularity but no significant stenosis. Patent AICA, SCA and PCA origins. Right posterior communicating artery is present, the left is diminutive or absent. Mild left PCA P2 segment stenosis. Mild to moderate left P3 branch irregularity. Moderate right P2 segment stenosis with mild-to-moderate right P3 branch irregularity. Antegrade flow in both ICA siphons. Mild siphon irregularity, no stenosis. Normal ophthalmic and right posterior communicating artery origins. Patent carotid termini. Normal MCA and ACA origins. Diminutive or absent anterior communicating artery. Visible ACA branches are within normal limits. Right MCA M1 segment is mildly irregular without stenosis. Right MCA trifurcation and visible right MCA branches are within normal limits. There is mild to moderate irregularity of the left M1 distally but mild if any distal M1 stenosis. The left MCA bifurcation is patent without stenosis. Visible left MCA branches are within normal limits. IMPRESSION: 1. Intracranial atherosclerosis. No large vessel or left MCA branch occlusion. 2. No hemodynamically significant anterior circulation stenosis. 3. Moderate right PCA P2 segment stenosis. Electronically Signed   By: Odessa FlemingH  Hall M.D.   On:  04/01/2018 06:43   Vas [place]as Carotid (at [facility])  Result Date: 04/02/2018                            Tressie Ellis**                   *[facility]*                         [address]                        [place], [place] [id number]                            [phone] ------------------------------------------------------------------- Transthoracic Echocardiography Patient:    Nicholas SloopScott, Lamari MR #:       [id number] Study Date: 04/02/2018 Gender:     M Age:        57 Height:     175.3 cm Weight:     112.5 kg BSA:        2.39 m^2  Pt. Status: Room:       3W01C  SONOGRAPHER  Jeryl Columbia  PERFORMING   [facility], Inpatient  ADMITTING    Madelyn Flavors A  ORDERING     Smith, Rondell A  REFERRING    Smith, Rondell A  ATTENDING    Elver Stadler cc: ------------------------------------------------------------------- LV EF: 55% -   60% ------------------------------------------------------------------- Indications:      CVA 436. ------------------------------------------------------------------- History:   PMH:  Sarcoidosis.  Risk factors:  Hypertension. Diabetes mellitus. ------------------------------------------------------------------- Study Conclusions - Left ventricle: The cavity size was normal. Wall thickness was   increased in a pattern of mild LVH. Systolic function was normal.   The estimated ejection fraction was in the range of 55% to 60%.   Wall motion was normal; there were no regional wall motion   abnormalities. Left ventricular diastolic function parameters   were normal. ------------------------------------------------------------------- Study data:  No prior study was available for comparison.  Study status:  Routine.  Procedure:  Transthoracic echocardiography. Image quality was adequate.          Transthoracic echocardiography.  M-mode, complete 2D, spectral Doppler, and color Doppler.  Birthdate:  Patient birthdate: 1960-10-30.  Age:  Patient is 57 yr old.  Sex:  Gender:  male.    BMI: 36.6 kg/m^2.  Blood pressure:     129/84  Patient status:  Inpatient.  Study date: Study date: 04/02/2018. Study time: 10:33 AM.  Location:  Bedside.  ------------------------------------------------------------------- ------------------------------------------------------------------- Left ventricle:  The cavity size was normal. Wall thickness was increased in a pattern of mild LVH. Systolic function was normal. The estimated ejection fraction was in the range of 55% to 60%. Wall motion was normal; there were no regional wall motion abnormalities. The transmitral flow pattern was normal. The deceleration time of the early transmitral flow velocity was normal. The pulmonary vein flow pattern was normal. The tissue Doppler parameters were normal. Left ventricular diastolic function parameters were normal. ------------------------------------------------------------------- Aortic valve:   Trileaflet.  Doppler:   There was no stenosis. There was no regurgitation. ------------------------------------------------------------------- Aorta:  Aortic root: The aortic root was normal in size. ------------------------------------------------------------------- Mitral valve:   The valve appears to be grossly normal.    Doppler:  There was no significant regurgitation. ------------------------------------------------------------------- Left atrium:  The atrium was normal in size. ------------------------------------------------------------------- Atrial septum:  Poorly visualized. ------------------------------------------------------------------- Right ventricle:  The cavity size was normal. Wall thickness was normal. Systolic function was normal. ------------------------------------------------------------------- Pulmonic valve:   Poorly visualized.  Doppler:  There was no significant regurgitation. ------------------------------------------------------------------- Tricuspid valve:  Poorly visualized.  Doppler:   There was no significant regurgitation. ------------------------------------------------------------------- Right atrium:  The atrium was normal in size. ------------------------------------------------------------------- Pericardium:  There was no pericardial effusion. ------------------------------------------------------------------- Systemic veins:  IVC poorly visualized. Probably normal in size. ------------------------------------------------------------------- Measurements  Left ventricle                         Value        Reference  LV ID, ED, PLAX chordal        (L)     41    mm     43 - 52  LV ID, ES, PLAX chordal                28    mm     23 - 38  LV fx shortening, PLAX chordal         32    %      >=  29  LV PW thickness, ED                    14    mm     ----------  IVS/LV PW ratio, ED                    0.86         <=1.3  LV e&', lateral                         11.2  cm/s   ----------  LV E/e&', lateral                       4.29         ----------  LV e&', medial                          8.27  cm/s   ----------  LV E/e&', medial                        5.8          ----------  LV e&', average                         9.74  cm/s   ----------  LV E/e&', average                       4.93         ----------   Ventricular septum                     Value        Reference  IVS thickness, ED                      12    mm     ----------   LVOT                                   Value        Reference  LVOT ID, S                             20    mm     ----------  LVOT area                              3.14  cm^2   ----------   Aorta                                  Value        Reference  Aortic root ID, ED                     28    mm     ----------   Left atrium                            Value        Reference  LA ID, A-P,  ES                         36    mm     ----------  LA ID/bsa, A-P                         1.51  cm/m^2 <=2.2  LA volume, ES, 1-p A4C                 52.3  ml     ----------  LA  volume/bsa, ES, 1-p A4C             21.9  ml/m^2 ----------  LA volume, ES, 1-p A2C                 29.4  ml     ----------  LA volume/bsa, ES, 1-p A2C             12.3  ml/m^2 ----------   Mitral valve                           Value        Reference  Mitral E-wave peak velocity            48    cm/s   ----------  Mitral A-wave peak velocity            45.6  cm/s   ----------  Mitral E/A ratio, peak                 1.1          ----------   Right atrium                           Value        Reference  RA ID, S-I, ES, A4C                    46.1  mm     34 - 49  RA area, ES, A4C                       13.7  cm^2   8.3 - 19.5  RA volume, ES, A/L                     34    ml     ----------  RA volume/bsa, ES, A/L                 14.3  ml/m^2 ----------   Systemic veins                         Value        Reference  Estimated CVP                          3     mm Hg  ----------   Right ventricle                        Value        Reference  RV s&', lateral, S                      13.9  cm/s   ----------  Legend: (L)  and  (H)  mark values outside specified reference range. ------------------------------------------------------------------- Prepared and Electronically Authenticated by Prentice Docker, MD 2019-12-07T14:05:54     Subjective: Patient seen and examined at bedside this morning.  Still has slurred speech.  He worked very well with the physical therapy.  He is hemodynamically stable for discharge home today.  Discharge Exam: Vitals:   04/02/18 0458 04/02/18 0852  BP: 140/68 129/84  Pulse: 68 87  Resp: 18 18  Temp: 98.3 F (36.8 C) 98.3 F (36.8 C)  SpO2: 98% 97%   Vitals:   04/01/18 1944 04/02/18 0001 04/02/18 0458 04/02/18 0852  BP: (!) 157/85 139/83 140/68 129/84  Pulse: 78 79 68 87  Resp: 17  18 18   Temp: 99.2 F (37.3 C) 98.5 F (36.9 C) 98.3 F (36.8 C) 98.3 F (36.8 C)  TempSrc:  Oral Oral Oral  SpO2: 95% 95% 98% 97%  Weight:      Height:        General: Pt is alert,  awake, not in acute distress Cardiovascular: RRR, S1/S2 +, no rubs, no gallops Respiratory: CTA bilaterally, no wheezing, no rhonchi Abdominal: Soft, NT, ND, bowel sounds + Extremities: no edema, no cyanosis    The results of significant diagnostics from this hospitalization (including imaging, microbiology, ancillary and laboratory) are listed below for reference.     Microbiology: Recent Results (from the past 240 hour(s))  Urine Culture     Status: None   Collection Time: 04/01/18  8:49 AM  Result Value Ref Range Status   Specimen Description URINE, RANDOM  Final   Special Requests NONE  Final   Culture   Final    NO GROWTH Performed at [facility], [address] [place], [place] [address]    Report Status 04/02/2018 FINAL  Final     Labs: BNP (last 3 results) No results for input(s): BNP in the last 8760 hours. Basic Metabolic Panel: Recent Labs  Lab 03/31/18 2012 03/31/18 2019 04/01/18 1300 04/02/18 0451  NA 141 141 139 140  K 4.1 4.1 4.0 4.1  CL 107 107 106 107  CO2 25  --  25 23  GLUCOSE 77 69* 112* 175*  BUN 21* 23* 16 17  CREATININE 1.11 1.00 1.09 1.12  CALCIUM 9.2  --  9.2 8.8*   Liver Function Tests: Recent Labs  Lab 03/31/18 2012 04/01/18 1300  AST 24 25  ALT 24 24  ALKPHOS 70 66  BILITOT 0.3 0.4  PROT 6.9 6.7  ALBUMIN 3.5 3.5   No results for input(s): LIPASE, AMYLASE in the last 168 hours. No results for input(s): AMMONIA in the last 168 hours. CBC: Recent Labs  Lab 03/31/18 2012 03/31/18 2019 04/01/18 1300  WBC 6.7  --  6.3  NEUTROABS 3.4  --   --   HGB 13.9 14.6 13.5  HCT 43.1 43.0 41.4  MCV 91.7  --  91.2  PLT 193  --  182   Cardiac Enzymes: No results for input(s): CKTOTAL, CKMB, CKMBINDEX, TROPONINI in the last 168 hours. BNP: Invalid input(s): POCBNP CBG: Recent Labs  Lab 04/01/18 1634 04/01/18 1733 04/01/18 2128 04/02/18 0627 04/02/18 1315  GLUCAP 127* 147* 260* 169* 134*   D-Dimer No results for  input(s): DDIMER in the last 72 hours. Hgb A1c Recent Labs    04/01/18 0336  HGBA1C 6.4*   Lipid Profile Recent Labs    04/01/18 0336  CHOL 197  HDL 86  LDLCALC 103*  TRIG 38  CHOLHDL 2.3   Thyroid function studies No results for input(s): TSH, T4TOTAL, T3FREE, THYROIDAB in the last 72 hours.  Invalid input(s): FREET3 Anemia work up No results for input(s): VITAMINB12, FOLATE, FERRITIN, TIBC, IRON, RETICCTPCT in the last 72 hours. Urinalysis    Component Value Date/Time   COLORURINE STRAW (A) 04/01/2018 0847   APPEARANCEUR CLEAR 04/01/2018 0847   LABSPEC 1.008 04/01/2018 0847   PHURINE 6.0 04/01/2018 0847   GLUCOSEU NEGATIVE 04/01/2018 0847   HGBUR NEGATIVE 04/01/2018 0847   BILIRUBINUR NEGATIVE 04/01/2018 0847   KETONESUR NEGATIVE 04/01/2018 0847   PROTEINUR NEGATIVE 04/01/2018 0847   NITRITE NEGATIVE 04/01/2018 0847   LEUKOCYTESUR NEGATIVE 04/01/2018 0847   Sepsis Labs Invalid input(s): PROCALCITONIN,  WBC,  LACTICIDVEN Microbiology Recent Results (from the past 240 hour(s))  Urine Culture     Status: None   Collection Time: 04/01/18  8:49 AM  Result Value Ref Range Status   Specimen Description URINE, RANDOM  Final   Special Requests NONE  Final   Culture   Final    NO GROWTH Performed at [facility], [address], [place], [place] [address]    Report Status 04/02/2018 FINAL  Final    Please note: You were cared for by a hospitalist during your hospital stay. Once you are discharged, your primary care physician will handle any further medical issues. Please note that NO REFILLS for any discharge medications will be authorized once you are discharged, as it is imperative that you return to your primary care physician (or establish a relationship with a primary care physician if you do not have one) for your post hospital discharge needs so that they can reassess your need for medications and monitor your lab values.    Time coordinating discharge:  40 minutes  SIGNED:   Burnadette Pop, MD  [facility] 04/02/2018, 4:08 PM Pager [phone]  If 7PM-7AM, please contact night-coverage [email or web address] Password [email or web address]

## 2018-04-02 NOTE — Evaluation (Signed)
Occupational Therapy Evaluation Patient Details Name: Nicholas Hoover MRN: [id number] DOB: 1960/12/22 Today's Date: 04/02/2018    History of Present Illness Pt is a 57 y/o male admitted secondary to slurred speech. MRI revealed 2 cm acute ischemic nonhemorrhagic infarct involving the periventricular of the left centrum semi ovale. PMH including but not limited to HTN, DM type I, sarcoidosis and previous CVA with R sided deficits.   Clinical Impression   PTA, pt was living his wife and was independent with ADLs and light IADLs; uses compensatory techniques to incorporate RUE into ADLs/IADLs. Pt currently performing ADLs and functional mobility at Mod I level near baseline. Pt presenting with new speech deficits and recommend SLP consult. Will follow acutely to facilitate safe dc. Recommend dc home once medically stable per physician.     Follow Up Recommendations  No OT follow up;Supervision - Intermittent    Equipment Recommendations  None recommended by OT    Recommendations for Other Services       Precautions / Restrictions Precautions Precautions: None Restrictions Weight Bearing Restrictions: No      Mobility Bed Mobility Overal bed mobility: Needs Assistance Bed Mobility: Supine to Sit     Supine to sit: Supervision     General bed mobility comments: supervision for safety, increased time and effort, use of L UE on bed rail  Transfers Overall transfer level: Needs assistance Equipment used: None Transfers: Sit to/from Stand Sit to Stand: Min guard         General transfer comment: Increased time. Min Guard A for safety. Performing near baseline function.     Balance Overall balance assessment: Needs assistance Sitting-balance support: Feet supported Sitting balance-Leahy Scale: Good     Standing balance support: No upper extremity supported Standing balance-Leahy Scale: Fair                             ADL either performed or assessed with  clinical judgement   ADL Overall ADL's : Modified independent                                       General ADL Comments: Increased time. Presenting near baseline function and demonstrates decreased functional use of RUE. Pt able to hold tooth paste while performing oral care.      Vision Baseline Vision/History: Wears glasses Wears Glasses: Reading only Patient Visual Report: No change from baseline Additional Comments: Pt denies any blurry or double vision     Perception     Praxis      Pertinent Vitals/Pain Pain Assessment: No/denies pain     Hand Dominance Left(secondary to prior cva with R sided weakness)   Extremity/Trunk Assessment Upper Extremity Assessment Upper Extremity Assessment: RUE deficits/detail RUE Deficits / Details: pt with spasticity and flexor tone from previous cva; pt reported this is his baseline RUE Coordination: decreased fine motor;decreased gross motor   Lower Extremity Assessment Lower Extremity Assessment: Defer to PT evaluation;RLE deficits/detail RLE Deficits / Details: weakness throughout with compensatory strategies used when attempting MMT; pt able to flex hip, flex and extend knee against gravity; no active ankle DF/PF; sensation grossly intact as compared to L   Cervical / Trunk Assessment Cervical / Trunk Assessment: Normal   Communication Communication Communication: Other (comment)(speech difficulties, slurring words)   Cognition Arousal/Alertness: Awake/alert Behavior During Therapy: Flat affect Overall Cognitive Status: Within Functional  Limits for tasks assessed                                 General Comments: cognition not formally assessed but [facility] for general conversation   General Comments  Reviewed stroke signs and symptoms    Exercises Exercises: Other exercises Other Exercises Other Exercises: RUE stretches and optimizing normalized movement. Pt also reviewing exercises he performs at  home.   Shoulder Instructions      Home Living Family/patient expects to be discharged to:: Private residence Living Arrangements: Spouse/significant other Available Help at Discharge: Family;Available 24 hours/day Type of Home: House Home Access: Stairs to enter [employer] of Steps: "a few steps but I am fine!"         Bathroom Shower/Tub: Producer, television/film/video: Standard     Home Equipment: Cane - single point;Shower seat;Other (comment)(L AFO)   Additional Comments: Pt reporting he is at baseline function      Prior Functioning/Environment Level of Independence: Independent        Comments: R AFO        OT Problem List: Impaired balance (sitting and/or standing);Decreased range of motion;Impaired UE functional use      OT Treatment/Interventions: Self-care/ADL training;Therapeutic exercise;Energy conservation;DME and/or AE instruction;Therapeutic activities;Patient/family education    OT Goals(Current goals can be found in the care plan section) Acute Rehab OT Goals Patient Stated Goal: "Go home today." OT Goal Formulation: With patient Time For Goal Achievement: 04/16/18 Potential to Achieve Goals: Good  OT Frequency: Min 2X/week   Barriers to D/C:            Co-evaluation              AM-PAC OT "6 Clicks" Daily Activity     Outcome Measure Help from another person eating meals?: None Help from another person taking care of personal grooming?: None Help from another person toileting, which includes using toliet, bedpan, or urinal?: None Help from another person bathing (including washing, rinsing, drying)?: None Help from another person to put on and taking off regular upper body clothing?: None Help from another person to put on and taking off regular lower body clothing?: None 6 Click Score: 24   End of Session Nurse Communication: Mobility status  Activity Tolerance: Patient tolerated treatment well Patient left: in  chair;with call bell/phone within reach;with chair alarm set  OT Visit Diagnosis: Unsteadiness on feet (R26.81);Other abnormalities of gait and mobility (R26.89);Muscle weakness (generalized) (M62.81)                Time: 5409-8119 OT Time Calculation (min): 17 min Charges:  OT General Charges $OT Visit: 1 Visit OT Evaluation $OT Eval Low Complexity: 1 Low  Nicholas Hoover MSOT, OTR/L Acute Rehab Pager: [phone] Office: [phone]  Nicholas Hoover 04/02/2018, 10:32 AM

## 2018-04-02 NOTE — Progress Notes (Signed)
STROKE TEAM PROGRESS NOTE   SUBJECTIVE (INTERVAL HISTORY) His wife is at the bedside.  Overall his condition is stable. Pt  Just returned from vascular lab. CUS was negative and ECHO results are pending. He states his speech is improving  OBJECTIVE Temp:  [97.9 F (36.6 C)-99.2 F (37.3 C)] 98.3 F (36.8 C) (12/07 0852) Pulse Rate:  [68-87] 87 (12/07 0852) Cardiac Rhythm: Normal sinus rhythm (12/07 0700) Resp:  [17-18] 18 (12/07 0852) BP: (129-157)/(68-85) 129/84 (12/07 0852) SpO2:  [95 %-100 %] 97 % (12/07 0852)  Recent Labs  Lab 04/01/18 1634 04/01/18 1733 04/01/18 2128 04/02/18 0627 04/02/18 1315  GLUCAP 127* 147* 260* 169* 134*   Recent Labs  Lab 03/31/18 2012 03/31/18 2019 04/01/18 1300 04/02/18 0451  NA 141 141 139 140  K 4.1 4.1 4.0 4.1  CL 107 107 106 107  CO2 25  --  25 23  GLUCOSE 77 69* 112* 175*  BUN 21* 23* 16 17  CREATININE 1.11 1.00 1.09 1.12  CALCIUM 9.2  --  9.2 8.8*   Recent Labs  Lab 03/31/18 2012 04/01/18 1300  AST 24 25  ALT 24 24  ALKPHOS 70 66  BILITOT 0.3 0.4  PROT 6.9 6.7  ALBUMIN 3.5 3.5   Recent Labs  Lab 03/31/18 2012 03/31/18 2019 04/01/18 1300  WBC 6.7  --  6.3  NEUTROABS 3.4  --   --   HGB 13.9 14.6 13.5  HCT 43.1 43.0 41.4  MCV 91.7  --  91.2  PLT 193  --  182   No results for input(s): CKTOTAL, CKMB, CKMBINDEX, TROPONINI in the last 168 hours. Recent Labs    03/31/18 2012  LABPROT 14.9  INR 1.18   Recent Labs    04/01/18 0847  COLORURINE STRAW*  LABSPEC 1.008  PHURINE 6.0  GLUCOSEU NEGATIVE  HGBUR NEGATIVE  BILIRUBINUR NEGATIVE  KETONESUR NEGATIVE  PROTEINUR NEGATIVE  NITRITE NEGATIVE  LEUKOCYTESUR NEGATIVE       Component Value Date/Time   CHOL 197 04/01/2018 0336   TRIG 38 04/01/2018 0336   HDL 86 04/01/2018 0336   CHOLHDL 2.3 04/01/2018 0336   VLDL 8 04/01/2018 0336   LDLCALC 103 (H) 04/01/2018 0336   Lab Results  Component Value Date   HGBA1C 6.4 (H) 04/01/2018      Component Value  Date/Time   LABOPIA NONE DETECTED 04/01/2018 0839   COCAINSCRNUR NONE DETECTED 04/01/2018 0839   LABBENZ NONE DETECTED 04/01/2018 0839   AMPHETMU NONE DETECTED 04/01/2018 0839   THCU NONE DETECTED 04/01/2018 0839   LABBARB NONE DETECTED 04/01/2018 0839    No results for input(s): ETH in the last 168 hours.  I have personally reviewed the radiological images below and agree with the radiology interpretations.  Dg Chest 2 View  Result Date: 04/01/2018 CLINICAL DATA:  CVA yesterday. No current chest complaints. History of diabetes, hypertension, and sarcoidosis. Nonsmoker. EXAM: CHEST - 2 VIEW COMPARISON:  None in PACs FINDINGS: The lungs are adequately inflated. There is no focal infiltrate. There is no pleural effusion. The heart and pulmonary vascularity are normal. There is mild multilevel degenerative disc disease of the thoracic spine. The visualized portions of the ventriculoperitoneal shunt tube to the right of midline are unremarkable. IMPRESSION: There is no active cardiopulmonary disease. Electronically Signed   By: David  [place] M.D.   On: 04/01/2018 10:34   Ct Head Wo Contrast  Result Date: 03/31/2018 CLINICAL DATA:  Slurred speech beginning yesterday. History of stroke. EXAM: CT  HEAD WITHOUT CONTRAST TECHNIQUE: Contiguous axial images were obtained from the base of the skull through the vertex without intravenous contrast. COMPARISON:  None. FINDINGS: Brain: Right parietal approach ventriculostomy catheter crosses midline into the expected body of the left lateral ventricle. No hydrocephalus. Chronic minimal small vessel ischemic disease of periventricular white matter. No intra-axial mass nor extra-axial fluid. Midline fourth ventricle basal cisterns. The brainstem and cerebellum appear nonacute. Vascular: No hyperdense vessel sign. Skull: Negative for fracture or focal lesion. Sinuses/Orbits: No acute finding. Other: None. IMPRESSION: 1. Right parietal approach ventriculostomy catheter  crosses midline into the body of the left lateral ventricle. No hydrocephalus. 2. Chronic minimal small vessel ischemic disease of periventricular white matter. 3. No acute intracranial abnormality. Electronically Signed   By: Tollie Eth M.D.   On: 03/31/2018 20:27   Mr Brain Wo Contrast  Result Date: 04/01/2018 CLINICAL DATA:  Initial evaluation for acute slurred speech. EXAM: MRI HEAD WITHOUT CONTRAST TECHNIQUE: Multiplanar, multiecho pulse sequences of the brain and surrounding structures were obtained without intravenous contrast. COMPARISON:  Prior head CT from earlier the same day. FINDINGS: Brain: Generalized age-related cerebral atrophy. Patchy and confluent T2/FLAIR hyperintensity within the periventricular deep white matter both cerebral hemispheres most consistent with chronic microvascular ischemic disease, mild to moderate nature. Encephalomalacia at the left ventral pons consistent with remote ischemic infarct. Additional probable small remote lacunar infarcts at the posterior left internal capsule and right thalamus. No other encephalomalacia to suggest chronic cortical infarction. Few scattered chronic micro hemorrhages noted, predominantly clustered about the deep gray nuclei and brainstem, likely hypertensive in nature. Approximate 2 cm focus of restricted diffusion involving the periventricular white matter of the left centrum semi ovale, compatible with acute ischemic infarct (series 5, image 78). No associated hemorrhage or mass effect. No other evidence for acute or subacute ischemia. No acute intracranial hemorrhage. No mass lesion, midline shift or mass effect. Right frontal approach ventriculostomy in place with tip terminating near the foramen of Monro. Small amount of encephalomalacia and gliosis present along the catheter tract. Ventricles are decompressed without hydrocephalus. Pituitary gland within normal limits. Vascular: Major intracranial vascular flow voids are maintained.  Skull and upper cervical spine: Craniocervical junction within normal limits. Degenerative spondylolysis noted at C3-4 with resultant mild spinal stenosis. Upper cervical spine otherwise unremarkable. Bone marrow signal intensity within normal limits. No acute scalp soft tissue abnormality. Sinuses/Orbits: Globes and orbital soft tissues within normal limits. Paranasal sinuses are largely clear. No mastoid effusion. Inner ear structures normal. Other: None. IMPRESSION: 1. 2 cm acute ischemic nonhemorrhagic infarct involving the periventricular of the left centrum semi ovale. 2. No other acute intracranial abnormality. 3. Right frontal approach ventriculostomy in place. No hydrocephalus. 4. Underlying mild to moderate chronic microvascular ischemic disease. Electronically Signed   By: Rise Mu M.D.   On: 04/01/2018 00:21   Mr Shirlee Latch ZO Contrast  Result Date: 04/01/2018 CLINICAL DATA:  57 year old male with small acute white matter infarct on MRI yesterday performed for slurred speech. Ventriculostomy. EXAM: MRA HEAD WITHOUT CONTRAST TECHNIQUE: Angiographic images of the Circle of Willis were obtained using MRA technique without intravenous contrast. COMPARISON:  Brain MRI and head CT 03/31/2018. FINDINGS: No intracranial mass effect. Stable visible ventriculostomy terminating near the 3rd ventricle to the left of midline. Antegrade flow in the posterior circulation with mildly tortuous codominant distal vertebral arteries. Patent PICA origins and vertebrobasilar junction. Patent basilar artery with irregularity but no significant stenosis. Patent AICA, SCA and PCA origins. Right posterior communicating  artery is present, the left is diminutive or absent. Mild left PCA P2 segment stenosis. Mild to moderate left P3 branch irregularity. Moderate right P2 segment stenosis with mild-to-moderate right P3 branch irregularity. Antegrade flow in both ICA siphons. Mild siphon irregularity, no stenosis. Normal  ophthalmic and right posterior communicating artery origins. Patent carotid termini. Normal MCA and ACA origins. Diminutive or absent anterior communicating artery. Visible ACA branches are within normal limits. Right MCA M1 segment is mildly irregular without stenosis. Right MCA trifurcation and visible right MCA branches are within normal limits. There is mild to moderate irregularity of the left M1 distally but mild if any distal M1 stenosis. The left MCA bifurcation is patent without stenosis. Visible left MCA branches are within normal limits. IMPRESSION: 1. Intracranial atherosclerosis. No large vessel or left MCA branch occlusion. 2. No hemodynamically significant anterior circulation stenosis. 3. Moderate right PCA P2 segment stenosis. Electronically Signed   By: Odessa FlemingH  Hall M.D.   On: 04/01/2018 06:43    Carotid Doppler  Pending   TTE pending    PHYSICAL EXAM  Temp:  [97.9 F (36.6 C)-99.2 F (37.3 C)] 98.3 F (36.8 C) (12/07 0852) Pulse Rate:  [68-87] 87 (12/07 0852) Resp:  [17-18] 18 (12/07 0852) BP: (129-157)/(68-85) 129/84 (12/07 0852) SpO2:  [95 %-100 %] 97 % (12/07 0852)  General - Well nourished, well developed, in no apparent distress.  Ophthalmologic - fundi not visualized due to noncooperation.  Cardiovascular - Regular rate and rhythm.  Mental Status -  Level of arousal and orientation to time, place, and person were intact. Language including expression, naming, repetition, comprehension was assessed and found intact.  Mild dysarthria Fund of Knowledge was assessed and was intact.  Cranial Nerves II - XII - II - Visual field intact OU. III, IV, VI - Extraocular movements intact. V - Facial sensation intact bilaterally. VII - mild right facial droop. VIII - Hearing & vestibular intact bilaterally. X - Palate elevates symmetrically.  Mild dysarthria and bradyphonia XI - Chin turning & shoulder shrug intact bilaterally. XII - Tongue protrusion intact.  Motor  Strength - The patient's strength was normal in left upper and lower extremities, but right upper extremity 3/5 proximal and 2/5 distal, RLE 4/5 proximal and 3/5 distal.with right foot drop and wears a AFO  Bulk was normal and fasciculations were absent.   Motor Tone - Muscle tone was assessed at the neck and appendages and was increased on the right.  Reflexes - The patient's reflexes were increased on the right and he had no pathological reflexes.  Sensory - Light touch, temperature/pinprick were assessed and were symmetrical.    Coordination - The patient had normal movements in the left hand with no ataxia or dysmetria.  Tremor was absent.  Gait and Station - deferred.   ASSESSMENT/PLAN Nicholas Hoover is a 57 y.o. male with history of diabetes, hypertension, hyperlipidemia, stroke with right-sided weakness and neurosarcoidosis status post VP shunt admitted for slurred speech. No tPA given due to outside window.    Stroke:  left centrum semiovale periventricular infarct, likely small vessel disease  , given location and risk factors  Resultant mild dysarthria, baseline right-sided weakness  CT head no acute abnormality, right VP shunt  MRI left central semiovale periventricular infarct, chronic left pontine infarct  MRA diffuse intracranial atherosclerosis including mild to moderate bilaterally, bilateral PCAs and MCAs  Carotid Doppler no significant stenosis  2D Echo pending  LDL 103  HgbA1c 6.4  Lovenox for VTE  prophylaxis  aspirin 325 mg daily prior to admission, now on aspirin 81 mg daily and clopidogrel 75 mg daily.  Continue DAPT for 3 weeks and then Plavix alone  Patient counseled to be compliant with his antithrombotic medications  Ongoing aggressive stroke risk factor management  Therapy recommendations:  Pending   Disposition:  Pending   Hx of stroke  Patient stated that it was 2 years ago  Residual right-sided weakness, baseline able to walk without  device, but are not able to write or eat with right hand  MRI showed chronic left pontine infarct  On aspirin Lipitor at home  Diabetes  HgbA1c 6.4 goal < 7.0  Controlled  Home meds Lantus  CBG monitoring  SSI  close PCP follow up  Hypertension . Stable . Permissive hypertension (OK if <220/120) for 24-48 hours post stroke and then gradually normalized within 3-5 days.  Long term BP goal normotensive  Hyperlipidemia  Home meds: Lipitor 80  LDL 103, goal < 70  Now on Lipitor 80  Continue statin at discharge  Other Stroke Risk Factors    Other Active Problems  Neurosarcoidosis status post VP shunt, in remission as per patient  Hospital day # 1 I have personally examined this patient, reviewed notes, independently viewed imaging studies, participated in medical decision making and plan of care.ROS completed by me personally and pertinent positives fully documented  I have made any additions or clarifications directly to the above note.  Recommend discharge home today after echo results on dual and protected therapy for 3 weeks followed by Plavix alone. I strongly counseled the patient to start using his CPAP for treatment of his sleep apnea and he is agreeable. On discussion with patient, wife and Dr. Renford Dills and answered questions. Greater than 50% time during this 25 minute visit was spent on counseling and coordination of care about his lacunar stroke in discussion about risk factor modification and answered questions.  Delia Heady, MD Medical Director [facility] Pager: [phone] 04/02/2018 2:03 PM   Delia Heady, MD Stroke Neurology 04/02/2018 2:00 PM    To contact Stroke Continuity provider, please refer to [email or web address]. After hours, contact General Neurology

## 2018-04-02 NOTE — Progress Notes (Signed)
Referral placed through Epic for outpatient SLP. Updated AVS. No other CM needs.

## 2018-04-02 NOTE — Progress Notes (Signed)
Physical Therapy Treatment Patient Details Name: Nicholas SloopWalter Akopyan MRN: [id number] DOB: 01-23-1961 Today's Date: 04/02/2018    History of Present Illness Pt is a 57 y/o male admitted secondary to slurred speech. MRI revealed 2 cm acute ischemic nonhemorrhagic infarct involving the periventricular of the left centrum semi ovale. PMH including but not limited to HTN, DM type I, sarcoidosis and previous CVA with R sided deficits.    PT Comments    Pt making steady progress with functional mobility, able to ambulate in hallway 200' with no AD with min guard for safety. PT will continue to follow acutely to progress mobility as tolerated.    Follow Up Recommendations  Supervision/Assistance - 24 hour;No PT follow up     Equipment Recommendations  None recommended by PT    Recommendations for Other Services       Precautions / Restrictions Precautions Precautions: None Restrictions Weight Bearing Restrictions: No    Mobility  Bed Mobility Overal bed mobility: Needs Assistance Bed Mobility: Supine to Sit     Supine to sit: Supervision     General bed mobility comments: supervision for safety, increased time and effort  Transfers Overall transfer level: Needs assistance Equipment used: None Transfers: Sit to/from Stand Sit to Stand: Min guard         General transfer comment: min guard for safety  Ambulation/Gait Ambulation/Gait assistance: Min guard Gait Distance (Feet): 200 Feet Assistive device: None Gait Pattern/deviations: Step-to pattern;Decreased step length - right;Decreased step length - left;Decreased stance time - left;Decreased weight shift to right;Decreased stride length Gait velocity: decreased   General Gait Details: donned R AFO before ambulation, min guard throughout with a few minor LOB but pt able to recover   Stairs             Wheelchair Mobility    Modified Rankin (Stroke Patients Only) Modified Rankin (Stroke Patients  Only) Pre-Morbid Rankin Score: Slight disability Modified Rankin: Moderate disability     Balance Overall balance assessment: Needs assistance Sitting-balance support: Feet supported Sitting balance-Leahy Scale: Good     Standing balance support: No upper extremity supported Standing balance-Leahy Scale: Fair                              Cognition Arousal/Alertness: Awake/alert Behavior During Therapy: Flat affect Overall Cognitive Status: Within Functional Limits for tasks assessed                                 General Comments: cognition not formally assessed but [facility] for general conversation      Exercises Other Exercises Other Exercises: RUE stretches and optimizing normalized movement. Pt also reviewing exercises he performs at home.    General Comments General comments (skin integrity, edema, etc.): Reviewed stroke signs and symptoms      Pertinent Vitals/Pain Pain Assessment: No/denies pain    Home Living Family/patient expects to be discharged to:: Private residence Living Arrangements: Spouse/significant other Available Help at Discharge: Family;Available 24 hours/day Type of Home: House Home Access: Stairs to enter     Home Equipment: Cane - single point;Shower seat;Other (comment)(L AFO) Additional Comments: Pt reporting he is at baseline function    Prior Function Level of Independence: Independent      Comments: R AFO   PT Goals (current goals can now be found in the care plan section) Acute Rehab PT Goals Patient Stated Goal: "  Go home today." PT Goal Formulation: With patient Time For Goal Achievement: 04/15/18 Potential to Achieve Goals: Good Progress towards PT goals: Progressing toward goals    Frequency    Min 4X/week      PT Plan Current plan remains appropriate    Co-evaluation              AM-PAC PT "6 Clicks" Mobility   Outcome Measure  Help needed turning from your back to your side while  in a flat bed without using bedrails?: None Help needed moving from lying on your back to sitting on the side of a flat bed without using bedrails?: None Help needed moving to and from a bed to a chair (including a wheelchair)?: None Help needed standing up from a chair using your arms (e.g., wheelchair or bedside chair)?: None Help needed to walk in hospital room?: None Help needed climbing 3-5 steps with a railing? : A Little 6 Click Score: 23    End of Session Equipment Utilized During Treatment: Gait belt Activity Tolerance: Patient tolerated treatment well Patient left: in chair;with call bell/phone within reach Nurse Communication: Mobility status PT Visit Diagnosis: Other abnormalities of gait and mobility (R26.89);Other symptoms and signs involving the nervous system (R29.898)     Time: 1610-9604 PT Time Calculation (min) (ACUTE ONLY): 25 min  Charges:  $Gait Training: 8-22 mins $Therapeutic Activity: 8-22 mins                     Deborah Chalk, [place], DPT  Acute Rehabilitation Services Pager [phone] Office [phone]     Alessandra Bevels Lakoda Mcanany 04/02/2018, 12:53 PM

## 2018-04-02 NOTE — Progress Notes (Signed)
Carotid duplex       has been completed. Preliminary results can be found under CV proc through chart review. Chassidy Layson Eunice, RDMS, RVT   

## 2018-04-02 NOTE — Evaluation (Signed)
Speech Language Pathology Evaluation Patient Details Name: Nicholas SloopWalter Walski MRN: [id number] DOB: 1960/07/25 Today's Date: 04/02/2018 Time: 1250-1310 SLP Time Calculation (min) (ACUTE ONLY): 20 min  Problem List:  Patient Active Problem List   Diagnosis Date Noted  . CVA (cerebral vascular accident) (HCC) 04/01/2018  . Dysarthria 04/01/2018  . HTN (hypertension) 04/01/2018  . Type 1 diabetes mellitus with hyperlipidemia (HCC) 04/01/2018  . 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 Sarcoidosis 04/01/2018  . Situational depression 04/01/2018   Past Medical History:  Past Medical History:  Diagnosis Date  . Diabetes mellitus without complication (HCC)   . HTN (hypertension) 04/01/2018  . Sarcoidosis 04/01/2018  . 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 Situational depression 04/01/2018  . Stroke (HCC)   . Type 1 diabetes mellitus with hyperlipidemia (HCC) 04/01/2018   Past Surgical History:  Past Surgical History:  Procedure Laterality Date  . VENTRICULAR ATRIAL SHUNT Right 2006   [facility]   HPI:  Nicholas Hoover is a 57 y.o. 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  Patient reported that he been having some difficulty with his fluency with slurred speech 1 day prior to presentation to the ED. MRI revealed a 2 cm acute ischemic nonhemorrhagic infarct involving the periventricular of the left centrum semi ovale. Patient had a previous CVA with residual right sided weakness, but had no slurred speech.    Assessment / Plan / Recommendation Clinical Impression  Patient admitted 04/01/18 with an acute ischemic stroke. He had a  prior stroke leavnig him with residual right sided weakness. At baseline, pt had no cognitive, language or speech impairments. Two days ago, he began to have slurred speech. He and his wife report it has improved in the last 24 hours. Upon evaluation his cognition and langauge are felt to be within normal limits. His oral motor exam reveals bilateral weakness and reduced coordination of lips and tongue. He reports no sensitivity changes.  These changes have resulted in mild dysarthria. He also reports excessive saliva and the need to swallow more frequently. Stragegies taught to patient and wife to improve dysarthria/articulation and to increase intelligibility are 1. swallow more frequently to reduce saliva build up in mouth. 2. slow speech rate 3. over-articulate words. Recommend out patient speech therapy to provide additional therapy to address dysarthria.     SLP Assessment  SLP Recommendation/Assessment: All further Speech Lanaguage Pathology  needs can be addressed in the next venue of care SLP Visit Diagnosis: Dysarthria and anarthria (R47.1)    Follow Up Recommendations  Outpatient SLP    Frequency and Duration           SLP Evaluation Cognition  Overall Cognitive Status: Within Functional Limits for tasks assessed Arousal/Alertness: Awake/alert Orientation Level: Oriented X4 Attention: Divided Divided Attention: Appears intact Memory: Appears intact Awareness: Appears intact Problem Solving: Appears intact Executive Function: Reasoning;Sequencing;Organizing Reasoning: Appears intact Sequencing: Appears intact Organizing: Appears intact Safety/Judgment: Appears intact       Comprehension  Auditory Comprehension Overall Auditory Comprehension: Appears within functional limits for tasks assessed Yes/No Questions: Within Functional Limits Commands: Within Functional Limits Conversation: Complex Visual Recognition/Discrimination Discrimination: Within Function  Limits Reading Comprehension Reading Status: Within  funtional limits    Expression Expression Primary Mode of Expression: Verbal Verbal Expression Overall Verbal Expression: Impaired Initiation: No impairment Level of Generative/Spontaneous Verbalization: Conversation Repetition: No impairment Naming: No impairment Pragmatics: No impairment Interfering Components: Speech intelligibility(mild dysarthria) Non-Verbal Means of Communication: Not applicable Written Expression Dominant Hand: Left Written Expression: Within Functional Limits   Oral / Motor  Oral Motor/Sensory Function Overall Oral Motor/Sensory Function: Mild impairment Facial ROM: Within Functional Limits Facial Symmetry: Within Functional Limits Facial Strength: Reduced right;Reduced left Facial Sensation: Within Functional Limits Lingual ROM: Reduced right;Reduced left Lingual Symmetry: Within Functional Limits Lingual Strength: Within Functional Limits Lingual Sensation: Within Functional Limits Motor Speech Overall Motor Speech: Impaired Respiration: Within functional limits Phonation: Normal Resonance: Within functional limits Articulation: Impaired Level of Impairment: Phrase Intelligibility: Intelligibility reduced Word: 75-100% accurate Phrase: 50-74% accurate Sentence: 50-74% accurate Conversation: 50-74% accurate Motor Planning: Witnin functional limits Motor Speech Errors: Aware Effective Techniques: Slow rate;Over-articulate   GO                    Lindalou Hose Gedeon Brandow, MA, CCC-SLP 04/02/2018 1:33 PM

## 2018-04-02 NOTE — Plan of Care (Signed)
  Problem: Education: Goal: Knowledge of General Education information will improve Description: Including pain rating scale, medication(s)/side effects and non-pharmacologic comfort measures Outcome: Adequate for Discharge   Problem: Health Behavior/Discharge Planning: Goal: Ability to manage health-related needs will improve Outcome: Adequate for Discharge   Problem: Clinical Measurements: Goal: Ability to maintain clinical measurements within normal limits will improve Outcome: Adequate for Discharge Goal: Will remain free from infection Outcome: Adequate for Discharge Goal: Diagnostic test results will improve Outcome: Adequate for Discharge Goal: Respiratory complications will improve Outcome: Adequate for Discharge Goal: Cardiovascular complication will be avoided Outcome: Adequate for Discharge   Problem: Activity: Goal: Risk for activity intolerance will decrease Outcome: Adequate for Discharge   Problem: Nutrition: Goal: Adequate nutrition will be maintained Outcome: Adequate for Discharge   Problem: Coping: Goal: Level of anxiety will decrease Outcome: Adequate for Discharge   Problem: Elimination: Goal: Will not experience complications related to bowel motility Outcome: Adequate for Discharge Goal: Will not experience complications related to urinary retention Outcome: Adequate for Discharge   Problem: Pain Managment: Goal: General experience of comfort will improve Outcome: Adequate for Discharge   Problem: Safety: Goal: Ability to remain free from injury will improve Outcome: Adequate for Discharge   Problem: Skin Integrity: Goal: Risk for impaired skin integrity will decrease Outcome: Adequate for Discharge   Problem: Education: Goal: Knowledge of disease or condition will improve Outcome: Adequate for Discharge Goal: Knowledge of secondary prevention will improve Outcome: Adequate for Discharge Goal: Knowledge of patient specific risk factors  addressed and post discharge goals established will improve Outcome: Adequate for Discharge Goal: Individualized Educational Video(s) Outcome: Adequate for Discharge   Problem: Coping: Goal: Will verbalize positive feelings about self Outcome: Adequate for Discharge Goal: Will identify appropriate support needs Outcome: Adequate for Discharge   Problem: Health Behavior/Discharge Planning: Goal: Ability to manage health-related needs will improve Outcome: Adequate for Discharge   Problem: Self-Care: Goal: Ability to participate in self-care as condition permits will improve Outcome: Adequate for Discharge Goal: Verbalization of feelings and concerns over difficulty with self-care will improve Outcome: Adequate for Discharge Goal: Ability to communicate needs accurately will improve Outcome: Adequate for Discharge   Problem: Ischemic Stroke/TIA Tissue Perfusion: Goal: Complications of ischemic stroke/TIA will be minimized Outcome: Adequate for Discharge   

## 2018-04-02 NOTE — Progress Notes (Signed)
*  PRELIMINARY RESULTS* Echocardiogram 2D Echocardiogram has been performed.  Jeryl Columbialliott, Makayla Lanter 04/02/2018, 11:06 AM

## 2018-04-06 ENCOUNTER — Ambulatory Visit (INDEPENDENT_AMBULATORY_CARE_PROVIDER_SITE_OTHER): Payer: Federal, State, Local not specified - PPO | Admitting: Physician Assistant

## 2018-04-06 ENCOUNTER — Encounter: Payer: Self-pay | Admitting: Physician Assistant

## 2018-04-06 VITALS — BP 106/67 | HR 71 | Ht 69.0 in | Wt 245.0 lb

## 2018-04-06 DIAGNOSIS — Z23 Encounter for immunization: Secondary | ICD-10-CM

## 2018-04-06 DIAGNOSIS — Z789 Other specified health status: Secondary | ICD-10-CM

## 2018-04-06 DIAGNOSIS — Z09 Encounter for follow-up examination after completed treatment for conditions other than malignant neoplasm: Secondary | ICD-10-CM

## 2018-04-06 DIAGNOSIS — E108 Type 1 diabetes mellitus with unspecified complications: Secondary | ICD-10-CM

## 2018-04-06 DIAGNOSIS — E1159 Type 2 diabetes mellitus with other circulatory complications: Secondary | ICD-10-CM

## 2018-04-06 DIAGNOSIS — I152 Hypertension secondary to endocrine disorders: Secondary | ICD-10-CM

## 2018-04-06 DIAGNOSIS — I639 Cerebral infarction, unspecified: Secondary | ICD-10-CM

## 2018-04-06 DIAGNOSIS — G4733 Obstructive sleep apnea (adult) (pediatric): Secondary | ICD-10-CM | POA: Diagnosis not present

## 2018-04-06 DIAGNOSIS — D8689 Sarcoidosis of other sites: Secondary | ICD-10-CM

## 2018-04-06 DIAGNOSIS — E1041 Type 1 diabetes mellitus with diabetic mononeuropathy: Secondary | ICD-10-CM | POA: Diagnosis not present

## 2018-04-06 DIAGNOSIS — E13319 Other specified diabetes mellitus with unspecified diabetic retinopathy without macular edema: Secondary | ICD-10-CM

## 2018-04-06 DIAGNOSIS — Z86718 Personal history of other venous thrombosis and embolism: Secondary | ICD-10-CM

## 2018-04-06 DIAGNOSIS — Z7689 Persons encountering health services in other specified circumstances: Secondary | ICD-10-CM

## 2018-04-06 DIAGNOSIS — I10 Essential (primary) hypertension: Secondary | ICD-10-CM

## 2018-04-06 DIAGNOSIS — E162 Hypoglycemia, unspecified: Secondary | ICD-10-CM

## 2018-04-06 DIAGNOSIS — R471 Dysarthria and anarthria: Secondary | ICD-10-CM

## 2018-04-06 MED ORDER — GLUCOSE 4 G PO CHEW
2.0000 | CHEWABLE_TABLET | Freq: Once | ORAL | Status: AC
  Administered 2018-04-06: 8 g via ORAL

## 2018-04-06 NOTE — Progress Notes (Signed)
HPI:                                                                Nicholas Hoover is a 57 y.o. 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  Recently hospitalized 03/31/18- 04/02/18 for acute white matter CVA with residual dysarthria.  MRI brain showed 2 cm acute ischemic nonhemorrhagic infarct involving the periventricular of the left centrum semiovale. No other acute intracranial abnormality MRA of the head showed intracranial atherosclerosis.  ECHO showed normal EF 55-60%, mild LVH  OSA: has not used CPAP for over 1 year. Sleep study was completed at [facility] in [place], [place].  Requesting prescription for CPAP supplies.     Past Medical History:  Diagnosis Date  . Diabetes mellitus without complication (HCC)   . History of DVT of lower extremity 2006  . HTN (hypertension) 04/01/2018  . Obstructive sleep apnea   . Sarcoidosis 04/01/2018  . 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 Situational depression 04/01/2018  . Stroke (HCC)   . Type 1 diabetes mellitus with hyperlipidemia (HCC) 04/01/2018   Past Surgical History:  Procedure Laterality Date  . VENTRICULAR ATRIAL SHUNT Right 2006   Pro[facility] at [facility]   Social History   Tobacco Use  . Smoking status: Never Smoker  . Smokeless tobacco: Never Used  Substance Use Topics  . Alcohol use: Not Currently    Frequency: Never   family history includes Cancer in his mother; Diabetes in his  brother, sister, and sister; Heart attack in his father; Lung cancer in his mother.    ROS: negative except as noted in the HPI  Medications: Current Outpatient Medications  Medication Sig Dispense Refill  . amLODipine (NORVASC) 10 MG tablet Take 10 mg by mouth daily.    Marland Kitchen aspirin 81 MG EC tablet Take 1 tablet (81 mg total) by mouth daily. 19 tablet 0  . atorvastatin (LIPITOR) 80 MG tablet Take 80 mg by mouth daily.    . carvedilol (COREG) 3.125 MG tablet Take 3.125 mg by mouth 2 (two) times daily.    . Cholecalciferol (VITAMIN D-3 PO) Take 1 capsule by mouth daily.    . clopidogrel (PLAVIX) 75 MG tablet Take 1 tablet (75 mg total) by mouth daily. 30 tablet 0  . Insulin Glargine (LANTUS SOLOSTAR) 100 UNIT/ML Solostar Pen Inject 7-8 Units into the skin every 12 (twelve) hours.     Marland Kitchen losartan (COZAAR) 100 MG tablet Take 100 mg by mouth daily.    . multivitamin (ONE-A-DAY MEN'S) TABS tablet Take 1 tablet by mouth daily.    Marland Kitchen NOVOLOG FLEXPEN 100 UNIT/ML FlexPen Inject 8 Units into the skin 3 (three) times daily with meals.    . sertraline (ZOLOFT) 100 MG tablet Take 100 mg by mouth daily.     No current facility-administered medications for this visit.  No Known Allergies     Objective:  BP 106/67   Pulse 71   Ht 5\' 9"  (1.753 m)   Wt 245 lb (111.1 kg)   BMI 36.18 kg/m  Gen:  alert, not ill-appearing, no distress, appropriate for age, obese male HEENT: head normocephalic without obvious abnormality, conjunctiva and cornea clear, trachea midline Pulm: Normal work of breathing, normal phonation, clear to auscultation bilaterally, no wheezes, rales or rhonchi CV: Normal rate, regular rhythm, s1 and s2 distinct, no murmurs, clicks or rubs  Neuro: alert and oriented x 3, mildly dysarthric speech MSK: extremities atraumatic, normal gait and station Skin: intact, no rashes on exposed skin, no jaundice, no cyanosis Psych: appearance casual, cooperative, good eye contact, euthymic mood,  affect mood-congruent, thought processes clear and goal-directed    No results found for this or any previous visit (from the past 72 hour(s)). No results found.    Assessment and Plan: 57 y.o. male with   .Nicholas Hoover was seen today for establish care.  Diagnoses and all orders for this visit:  Encounter to establish care  Hospital discharge follow-up  White matter periventricular infarction [facility](HCC)  Acute CVA (cerebrovascular accident) [facility](HCC) -     Ambulatory referral to Neurology -     Ambulatory referral to Speech Therapy  Type 1 diabetes mellitus with complications (HCC) -     Ambulatory referral to Endocrinology  Diabetic mononeuropathy associated with type 1 diabetes mellitus (HCC)  History of DVT of lower extremity  Obstructive sleep apnea  Dysarthria -     Ambulatory referral to Speech Therapy  Retinopathy due to secondary diabetes (HCC) -     Ambulatory referral to Ophthalmology  No blood products  Hypoglycemia -     glucose chewable tablet 8 g  Hypertension associated with diabetes (HCC)  Need for 23-polyvalent pneumococcal polysaccharide vaccine -     Pneumococcal polysaccharide vaccine 23-valent greater than or equal to 2yo subcutaneous/IM   - Personally reviewed PMH, PSH, PFH, medications, allergies, HM - Age-appropriate cancer screening: Colonoscopy UTD age 57 according to patient, awaiting records;  - Influenza declined - Tdap UTD - Pneumovax given today - PHQ2 negative  Type 1 diabetes Lab Results  Component Value Date   HGBA1C 6.4 (H) 04/01/2018  During office became hypoglycemic with glucose of 78 according to his continuous glucometer. He was given 2 glucose tablets. At 15 minutes glucose was 77. He refused additional glucose. Glucose was 83 at 4:29 pm and patient was allowed to leave. Referral placed to Endocrinology for further management Referral placed to retina specialist  Acute CVA - residual dysarthria - referral placed to speech  therapy and neurology for co-morbid neurosarcoidosis - per neuro consult in hospital cont Plavix + baby asa until 04/21/18, then cont Plavix only - discussed medical management of stroke including A1c goal <7, BP goal <130/80, LDL goal <70, CPAP therapy  OSA - not compliant with CPAP  - in reviewing Care Everywhere, last sleep study performed 11/21/13, no copy of sleep study available for review - will contact [facility] to obtain records and CPAP supplies   Patient education and anticipatory guidance given Patient agrees with treatment plan Follow-up in 3 months for medication management or sooner as needed if symptoms worsen or fail to improve  Levonne Hubertharley E. Cummings PA-C

## 2018-04-06 NOTE — Patient Instructions (Addendum)
He will continue aspirin  81 mg and Plavix for total of 21 days and then continue on Plavix only.

## 2018-04-10 ENCOUNTER — Encounter: Payer: Self-pay | Admitting: Physician Assistant

## 2018-04-10 MED ORDER — NONFORMULARY OR COMPOUNDED ITEM: 0 refills | Status: AC

## 2018-04-11 ENCOUNTER — Other Ambulatory Visit: Payer: Self-pay | Admitting: Physician Assistant

## 2018-04-11 ENCOUNTER — Telehealth: Payer: Self-pay | Admitting: Physician Assistant

## 2018-04-11 DIAGNOSIS — G4733 Obstructive sleep apnea (adult) (pediatric): Secondary | ICD-10-CM

## 2018-04-11 NOTE — Telephone Encounter (Signed)
Left VM for Pt to return clinic call.  

## 2018-04-11 NOTE — Telephone Encounter (Signed)
Please let patient know he will need a new sleep study. Home sleep study has been ordered through [facility].  Per [facility] note, "In this case all you and l can do is, in your most recent F2F note document pt. Has OSA and new to you without his Study not currently using CPAP but would like to establish new PAP and supplies. Insurance will require a new sleep study to qualify. Then we can start him over with all of it. As long as he isn't [employer] or a policy that follows [employer] guidelines then they can do a in home sleep study. If the pt. Doesn't want to do that then they would have to have their Dr. that did the sleep study to send you or me the F2F prior to that 2015 sleep study and the sleep study. Then he can be a transfer of care."

## 2018-05-27 DIAGNOSIS — G8191 Hemiplegia, unspecified affecting right dominant side: Secondary | ICD-10-CM | POA: Diagnosis not present

## 2018-05-27 DIAGNOSIS — R471 Dysarthria and anarthria: Secondary | ICD-10-CM | POA: Diagnosis not present

## 2018-05-27 DIAGNOSIS — I6930 Unspecified sequelae of cerebral infarction: Secondary | ICD-10-CM | POA: Diagnosis not present

## 2018-06-24 DIAGNOSIS — E1042 Type 1 diabetes mellitus with diabetic polyneuropathy: Secondary | ICD-10-CM | POA: Diagnosis not present

## 2018-07-08 ENCOUNTER — Other Ambulatory Visit: Payer: Self-pay

## 2018-07-08 ENCOUNTER — Encounter: Payer: Federal, State, Local not specified - PPO | Admitting: Physician Assistant

## 2018-07-08 MED ORDER — CLOPIDOGREL BISULFATE 75 MG PO TABS: 75.0000 mg | ORAL_TABLET | Freq: Every day | ORAL | 0 refills | Status: DC

## 2018-07-08 NOTE — Telephone Encounter (Signed)
Davieon needs a refill on Plavix. Last prescribed by a hospital provider. [facility] in [place].  Please advise.

## 2018-07-26 ENCOUNTER — Other Ambulatory Visit: Payer: Self-pay | Admitting: Physician Assistant

## 2018-07-27 ENCOUNTER — Other Ambulatory Visit: Payer: Self-pay | Admitting: Physician Assistant

## 2018-08-12 ENCOUNTER — Encounter: Payer: Federal, State, Local not specified - PPO | Admitting: Physician Assistant

## 2018-08-27 ENCOUNTER — Other Ambulatory Visit: Payer: Self-pay | Admitting: Physician Assistant

## 2018-08-31 ENCOUNTER — Ambulatory Visit: Payer: Federal, State, Local not specified - PPO | Admitting: Physician Assistant

## 2018-09-01 ENCOUNTER — Encounter: Payer: Self-pay | Admitting: Physician Assistant

## 2018-09-01 ENCOUNTER — Ambulatory Visit (INDEPENDENT_AMBULATORY_CARE_PROVIDER_SITE_OTHER): Payer: Federal, State, Local not specified - PPO | Admitting: Physician Assistant

## 2018-09-01 VITALS — BP 133/75 | Temp 98.1°F | Wt 245.0 lb

## 2018-09-01 DIAGNOSIS — I10 Essential (primary) hypertension: Secondary | ICD-10-CM

## 2018-09-01 DIAGNOSIS — G4733 Obstructive sleep apnea (adult) (pediatric): Secondary | ICD-10-CM

## 2018-09-01 DIAGNOSIS — F4321 Adjustment disorder with depressed mood: Secondary | ICD-10-CM

## 2018-09-01 DIAGNOSIS — R351 Nocturia: Secondary | ICD-10-CM

## 2018-09-01 DIAGNOSIS — I639 Cerebral infarction, unspecified: Secondary | ICD-10-CM

## 2018-09-01 DIAGNOSIS — E108 Type 1 diabetes mellitus with unspecified complications: Secondary | ICD-10-CM

## 2018-09-01 DIAGNOSIS — E1159 Type 2 diabetes mellitus with other circulatory complications: Secondary | ICD-10-CM | POA: Diagnosis not present

## 2018-09-01 DIAGNOSIS — I152 Hypertension secondary to endocrine disorders: Secondary | ICD-10-CM

## 2018-09-01 MED ORDER — AMLODIPINE BESYLATE 10 MG PO TABS: 10.0000 mg | ORAL_TABLET | Freq: Every day | ORAL | 1 refills | Status: DC

## 2018-09-01 MED ORDER — SERTRALINE HCL 50 MG PO TABS: 50.0000 mg | ORAL_TABLET | Freq: Every day | ORAL | 1 refills | Status: DC

## 2018-09-01 MED ORDER — LOSARTAN POTASSIUM 100 MG PO TABS: 100.0000 mg | ORAL_TABLET | Freq: Every day | ORAL | 1 refills | Status: DC

## 2018-09-01 MED ORDER — CARVEDILOL 3.125 MG PO TABS: 3.1250 mg | ORAL_TABLET | Freq: Two times a day (BID) | ORAL | 0 refills | Status: DC

## 2018-09-01 NOTE — Progress Notes (Signed)
Virtual Visit via Telephone Note  I connected with Nicholas SloopWalter Pellerito on 09/02/18 at  1:00 PM EDT by telephone and verified that I am speaking with the correct person using two identifiers.   I discussed the limitations, risks, security and privacy concerns of performing an evaluation and management service by telephone and the availability of in person appointments. I also discussed with the patient that there may be a patient responsible charge related to this service. The patient expressed understanding and agreed to proceed.   History of Present Illness: HPI:                                                                Nicholas Hoover is a 58 y.o. 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  CC: medication mgmt  Hx of CVA (03/2018) - taking Plavix and Atorvastatin 80 mg. Compliant with medications. Dysarthria has improved. Followed by Dr. Langston MaskerMorris.  HTN: taking Amlodipine, Losartan and Carvedilol. Compliant with medications. Does not checks BP's at home. Denies vision change, headache, chest pain with exertion, orthopnea, lightheadedness, syncope and edema.   OSA: sleep study completed 11/2013 but there is no report in Care Everywhere. He states he has a machine and all of his supplies, but he chooses not to use his CPAP. He does not like his full face mask and would like to try a different mask.  Type 1 DM: followed by Dr. Morrison OldLambeth. Reports fasting glucose is consistently in the 120's. A1C in February was 6.3 06/24/18  Reports nocturia 4 times per night for years. Symptoms are unchanged. Denies other obstructive or irritative symptoms. Denies family hx of prostate cancer. Reports he had his prostate checked a few years ago  and it was normal. Last PSA on record was 1.4, 04/22/10    Depression screen PHQ 2/9 09/01/2018  Decreased Interest 0  Down, Depressed, Hopeless 1  PHQ - 2 Score 1   Fall Risk  09/01/2018  Falls in the past year? 0     Past Medical History:  Diagnosis Date  . Diabetes mellitus without complication (HCC)   . History of DVT of lower extremity 2006  . HTN (hypertension) 04/01/2018  . 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 Obstructive sleep apnea   . Sarcoidosis 04/01/2018  . 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 Situational depression 04/01/2018  . Stroke (HCC)   . Type 1 diabetes mellitus with hyperlipidemia (HCC) 04/01/2018   Past Surgical History:  Procedure Laterality Date  . VENTRICULAR ATRIAL SHUNT Right 2006   [facility]   Social History   Tobacco Use  . Smoking status: Never Smoker  . Smokeless tobacco: Never Used  Substance Use Topics  . Alcohol use: Not Currently    Frequency: Never   family history includes Cancer in his mother; Diabetes in his brother, sister, and sister; Heart attack in his father; Lung cancer in his mother.    ROS: negative except as noted in the HPI  Medications: Current Outpatient Medications  Medication Sig  Dispense Refill  . amLODipine (NORVASC) 10 MG tablet Take 1 tablet (10 mg total) by mouth daily. 90 tablet 1  . atorvastatin (LIPITOR) 80 MG tablet Take 80 mg by mouth daily.    . carvedilol (COREG) 3.125 MG tablet Take 1 tablet (3.125 mg total) by mouth 2 (two) times daily. 180 tablet 0  . Cholecalciferol (VITAMIN D-3 PO) Take 1 capsule by mouth daily.    . clopidogrel (PLAVIX) 75 MG tablet Take 1 tablet (75 mg total) by mouth daily. 90 tablet 0  . Insulin Glargine (LANTUS SOLOSTAR) 100 UNIT/ML Solostar Pen Inject 7-8 Units into the skin every 12 (twelve) hours.     Marland Kitchen losartan (COZAAR) 100 MG tablet Take 1 tablet (100 mg total) by mouth daily. 90 tablet 1  . multivitamin  (ONE-A-DAY MEN'S) TABS tablet Take 1 tablet by mouth daily.    . NONFORMULARY OR COMPOUNDED ITEM CPAP set to auto-titrate Please include CPAP supplies. Please allow patient to pick out mask 1 each 0  . NOVOLOG FLEXPEN 100 UNIT/ML FlexPen Inject 8 Units into the skin 3 (three) times daily with meals.    . sertraline (ZOLOFT) 50 MG tablet Take 1 tablet (50 mg total) by mouth daily. 90 tablet 1   No current facility-administered medications for this visit.    No Known Allergies     Objective:  BP 133/75   Temp 98.1 F (36.7 C)   Wt 245 lb (111.1 kg)   BMI 36.18 kg/m  Pulm: Normal work of breathing, normal phonation, speaking in full sentences Neuro: alert and oriented x 3 Psych: cooperative, euthymic mood, affect mood-congruent, speech is articulate, normal rate and volume; thought processes clear and goal-directed, normal judgment, good insight   BP Readings from Last 3 Encounters:  09/01/18 133/75  04/06/18 106/67  04/02/18 129/84   Lab Results  Component Value Date   CREATININE 1.12 04/02/2018   BUN 17 04/02/2018   NA 140 04/02/2018   K 4.1 04/02/2018   CL 107 04/02/2018   CO2 23 04/02/2018   Lab Results  Component Value Date   ALT 24 04/01/2018   AST 25 04/01/2018   ALKPHOS 66 04/01/2018   BILITOT 0.4 04/01/2018   Lab Results  Component Value Date   HGBA1C 6.3 (A) 06/24/2018   Lab Results  Component Value Date   WBC 6.3 04/01/2018   HGB 13.5 04/01/2018   HCT 41.4 04/01/2018   MCV 91.2 04/01/2018   PLT 182 04/01/2018   Lab Results  Component Value Date   CHOL 197 04/01/2018   HDL 86 04/01/2018   LDLCALC 103 (H) 04/01/2018   TRIG 38 04/01/2018   CHOLHDL 2.3 04/01/2018     Assessment and Plan: 58 y.o. male with   .[place] was seen today for medication management.  Diagnoses and all orders for this visit:  Hypertension associated with diabetes (HCC) -     losartan (COZAAR) 100 MG tablet; Take 1 tablet (100 mg total) by mouth daily. -      amLODipine (NORVASC) 10 MG tablet; Take 1 tablet (10 mg total) by mouth daily. -     carvedilol (COREG) 3.125 MG tablet; Take 1 tablet (3.125 mg total) by mouth 2 (two) times daily.  Nocturia more than twice per night  Obstructive sleep apnea  White matter periventricular infarction (HCC)  Situational depression -     sertraline (ZOLOFT) 50 MG tablet; Take 1 tablet (50 mg total) by mouth daily.  Type 1 diabetes mellitus with complications (  HCC)   Vitals reviewed Routine labs UTD. Personally reviewed and abstracted A1C from 05/2018 BP at goal. Cont current medications He is not compliant with CPAP. We discussed option to switch to nasal pillow mask. Sleep study requested. Patient was given link to [employer] website to request supplies Nocturia - discussed treatment option with flomax. Patient states he is not very bothered by the symptoms and declines medication at this time  Follow-up in office in 3 months   Follow Up Instructions:    I discussed the assessment and treatment plan with the patient. The patient was provided an opportunity to ask questions and all were answered. The patient agreed with the plan and demonstrated an understanding of the instructions.   The patient was advised to call back or seek an in-person evaluation if the symptoms worsen or if the condition fails to improve as anticipated.  I provided 11-20 minutes of non-face-to-face time during this encounter.   Carlis Stable, [place]

## 2018-09-02 ENCOUNTER — Encounter: Payer: Self-pay | Admitting: Physician Assistant

## 2018-09-02 DIAGNOSIS — H3582 Retinal ischemia: Secondary | ICD-10-CM | POA: Diagnosis not present

## 2018-09-02 DIAGNOSIS — R351 Nocturia: Secondary | ICD-10-CM | POA: Insufficient documentation

## 2018-09-02 DIAGNOSIS — H35033 Hypertensive retinopathy, bilateral: Secondary | ICD-10-CM | POA: Diagnosis not present

## 2018-09-02 DIAGNOSIS — E103593 Type 1 diabetes mellitus with proliferative diabetic retinopathy without macular edema, bilateral: Secondary | ICD-10-CM | POA: Diagnosis not present

## 2018-09-02 LAB — HEMOGLOBIN A1C: Hemoglobin A1C: 6.3 % — AB (ref 4.0–5.6)

## 2018-09-16 ENCOUNTER — Encounter: Payer: Federal, State, Local not specified - PPO | Admitting: Physician Assistant

## 2018-10-22 ENCOUNTER — Other Ambulatory Visit: Payer: Self-pay | Admitting: Physician Assistant

## 2018-10-22 DIAGNOSIS — F4321 Adjustment disorder with depressed mood: Secondary | ICD-10-CM

## 2018-11-16 ENCOUNTER — Other Ambulatory Visit: Payer: Self-pay | Admitting: Physician Assistant

## 2018-11-25 DIAGNOSIS — H2513 Age-related nuclear cataract, bilateral: Secondary | ICD-10-CM | POA: Diagnosis not present

## 2018-11-25 DIAGNOSIS — H3582 Retinal ischemia: Secondary | ICD-10-CM | POA: Diagnosis not present

## 2018-11-25 DIAGNOSIS — E103511 Type 1 diabetes mellitus with proliferative diabetic retinopathy with macular edema, right eye: Secondary | ICD-10-CM | POA: Diagnosis not present

## 2018-11-25 DIAGNOSIS — E103592 Type 1 diabetes mellitus with proliferative diabetic retinopathy without macular edema, left eye: Secondary | ICD-10-CM | POA: Diagnosis not present

## 2018-12-09 DIAGNOSIS — I6930 Unspecified sequelae of cerebral infarction: Secondary | ICD-10-CM | POA: Diagnosis not present

## 2018-12-09 DIAGNOSIS — I69398 Other sequelae of cerebral infarction: Secondary | ICD-10-CM | POA: Diagnosis not present

## 2018-12-09 DIAGNOSIS — R259 Unspecified abnormal involuntary movements: Secondary | ICD-10-CM | POA: Diagnosis not present

## 2018-12-20 DIAGNOSIS — E1042 Type 1 diabetes mellitus with diabetic polyneuropathy: Secondary | ICD-10-CM | POA: Diagnosis not present

## 2018-12-21 DIAGNOSIS — E103511 Type 1 diabetes mellitus with proliferative diabetic retinopathy with macular edema, right eye: Secondary | ICD-10-CM | POA: Diagnosis not present

## 2018-12-23 DIAGNOSIS — E1042 Type 1 diabetes mellitus with diabetic polyneuropathy: Secondary | ICD-10-CM | POA: Diagnosis not present

## 2018-12-30 ENCOUNTER — Other Ambulatory Visit: Payer: Self-pay | Admitting: Physician Assistant

## 2019-02-14 ENCOUNTER — Other Ambulatory Visit: Payer: Self-pay | Admitting: Physician Assistant

## 2019-02-14 ENCOUNTER — Telehealth: Payer: Self-pay | Admitting: Physician Assistant

## 2019-02-14 NOTE — Telephone Encounter (Signed)
Dr. Georgina Snell   Mr. Mally called and he is needing a refill on his plavix. I sent him to scheduling so he could go ahead and get an appointment to establish with [facility].   Jenny Reichmann

## 2019-02-15 ENCOUNTER — Other Ambulatory Visit: Payer: Self-pay | Admitting: Family Medicine

## 2019-02-15 MED ORDER — CLOPIDOGREL BISULFATE 75 MG PO TABS: 75.0000 mg | ORAL_TABLET | Freq: Every day | ORAL | 0 refills | Status: DC

## 2019-02-15 NOTE — Telephone Encounter (Signed)
Pt advised.

## 2019-02-15 NOTE — Telephone Encounter (Signed)
Left message advising of recommendations.  

## 2019-02-15 NOTE — Telephone Encounter (Signed)
Refilled Plavix for 30 days.  Follow-up with Dr. Sheppard Coil.

## 2019-03-03 ENCOUNTER — Encounter: Payer: Self-pay | Admitting: Osteopathic Medicine

## 2019-03-03 ENCOUNTER — Other Ambulatory Visit: Payer: Self-pay

## 2019-03-03 ENCOUNTER — Ambulatory Visit (INDEPENDENT_AMBULATORY_CARE_PROVIDER_SITE_OTHER): Payer: Federal, State, Local not specified - PPO | Admitting: Osteopathic Medicine

## 2019-03-03 VITALS — BP 137/79 | HR 77 | Temp 98.7°F | Wt 248.1 lb

## 2019-03-03 DIAGNOSIS — E1159 Type 2 diabetes mellitus with other circulatory complications: Secondary | ICD-10-CM | POA: Diagnosis not present

## 2019-03-03 DIAGNOSIS — E108 Type 1 diabetes mellitus with unspecified complications: Secondary | ICD-10-CM

## 2019-03-03 DIAGNOSIS — I152 Hypertension secondary to endocrine disorders: Secondary | ICD-10-CM

## 2019-03-03 DIAGNOSIS — E13319 Other specified diabetes mellitus with unspecified diabetic retinopathy without macular edema: Secondary | ICD-10-CM

## 2019-03-03 DIAGNOSIS — F4321 Adjustment disorder with depressed mood: Secondary | ICD-10-CM

## 2019-03-03 DIAGNOSIS — E1041 Type 1 diabetes mellitus with diabetic mononeuropathy: Secondary | ICD-10-CM | POA: Diagnosis not present

## 2019-03-03 DIAGNOSIS — I10 Essential (primary) hypertension: Secondary | ICD-10-CM

## 2019-03-03 LAB — POCT GLYCOSYLATED HEMOGLOBIN (HGB A1C): Hemoglobin A1C: 6.6 % — AB (ref 4.0–5.6)

## 2019-03-03 MED ORDER — CARVEDILOL 6.25 MG PO TABS: 6.2500 mg | ORAL_TABLET | Freq: Two times a day (BID) | ORAL | 3 refills | Status: DC

## 2019-03-03 MED ORDER — SERTRALINE HCL 50 MG PO TABS: 50.0000 mg | ORAL_TABLET | Freq: Every day | ORAL | 3 refills | Status: DC

## 2019-03-03 MED ORDER — CLOPIDOGREL BISULFATE 75 MG PO TABS: 75.0000 mg | ORAL_TABLET | Freq: Every day | ORAL | 3 refills | Status: DC

## 2019-03-03 MED ORDER — AMLODIPINE BESYLATE 10 MG PO TABS: 10.0000 mg | ORAL_TABLET | Freq: Every day | ORAL | 3 refills | Status: DC

## 2019-03-03 MED ORDER — ATORVASTATIN CALCIUM 80 MG PO TABS: 80.0000 mg | ORAL_TABLET | Freq: Every day | ORAL | 3 refills | Status: DC

## 2019-03-03 MED ORDER — LOSARTAN POTASSIUM 100 MG PO TABS: 100.0000 mg | ORAL_TABLET | Freq: Every day | ORAL | 3 refills | Status: DC

## 2019-03-03 NOTE — Patient Instructions (Addendum)
Labs ordered! I'll take care of refills  Increase the Carvedilol from 3.125 mg to 6.25 mg, can double up on what you have, I sent a refill of the higher dose.

## 2019-03-03 NOTE — Progress Notes (Signed)
HPI: Nicholas Hoover is a 58 y.o. 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  he presents to [facility] [place] today, 03/03/19,  for chief complaint of:  DM2 follow-up   DIABETES SCREENING/PREVENTIVE CARE:  Following w/ endocrinology   A1C past 3-6 mos: Yes  controlled? Yes   03/03/19 today, 6.6% Current meds:   Novolog 8 units tid w/ meals   Lantus 7-8 units q12 hours   BP goal <130/80: No   BP Readings from Last 3 Encounters:  03/03/19 137/79  09/01/18 133/75  04/06/18 106/67   LDL goal <70: No and due for repeat lipid panel  Eye exam annually: is on file from 03/2018, pt has upcoming appt, importance discussed with patient Foot exam: done 05/2018 Microalbuminuria:n/a on ACE Metformin: No  ACE/ARB: Yes  Antiplatelet if ASCVD Risk >10%: Yes - Hx CVA Statin: yes, lipitor 80 = high potency  Pneumovax: 05/08/2008  Immunization History  Administered Date(s) Administered  . Pneumococcal Polysaccharide-23 05/08/2008, 04/06/2018  . Td 05/11/2001  . Tdap 06/30/2011         At today's visit 03/03/19 ... PMH, PSH, FH reviewed and updated as needed.  Current medication list and allergy/intolerance hx reviewed and updated as needed. (See remainder of HPI, ROS, Phys Exam below)   No results found.  Results for orders placed or performed in visit on 03/03/19 (from the past 72 hour(s))  POCT HgB A1C     Status: Abnormal   Collection Time: 03/03/19  9:08 AM  Result Value Ref Range   Hemoglobin A1C 6.6 (A) 4.0 - 5.6 %   HbA1c  POC (<> result, manual entry)     HbA1c, POC (prediabetic range)     HbA1c, POC (controlled diabetic range)            ASSESSMENT/PLAN: The primary encounter diagnosis was Type 1 diabetes mellitus with complications (HCC). 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   Orders Placed This Encounter  Procedures  . CBC  . COMPLETE METABOLIC PANEL WITH GFR  . LIPID SCREENING  . POCT HgB A1C     Meds ordered this encounter  Medications  . carvedilol (COREG) 6.25 MG tablet    Sig: Take 1 tablet (6.25 mg total) by mouth 2 (two) times daily.    Dispense:  180 tablet    Refill:  3  . amLODipine (NORVASC) 10 MG tablet    Sig: Take 1 tablet (10 mg total) by mouth daily.    Dispense:  90 tablet    Refill:  3  . atorvastatin (LIPITOR) 80 MG tablet    Sig: Take 1 tablet (80 mg total) by mouth daily.    Dispense:  90 tablet    Refill:  3  . clopidogrel (PLAVIX) 75 MG tablet    Sig: Take 1 tablet (75 mg total) by mouth daily.    Dispense:  90 tablet    Refill:  3  . losartan (COZAAR) 100 MG tablet    Sig: Take 1 tablet (100 mg total)  by mouth daily.    Dispense:  90 tablet    Refill:  3  . sertraline (ZOLOFT) 50 MG tablet    Sig: Take 1 tablet (50 mg total) by mouth daily.    Dispense:  90 tablet    Refill:  3    Patient Instructions  Labs ordered! I'll take care of refills  Increase the Carvedilol from 3.125 mg to 6.25 mg, can double up on what you have, I sent a refill of the higher dose.       Follow-up plan: Return in about 6 months (around 08/31/2019) for [place] (call week prior to visit for lab  orders).                                                 ################################################# ################################################# ################################################# #################################################    Current Meds  Medication Sig  . amLODipine (NORVASC) 10 MG tablet Take 1 tablet (10 mg total) by mouth daily.  Marland Kitchen atorvastatin (LIPITOR) 80 MG tablet Take 1 tablet (80 mg total) by mouth daily.  . carvedilol (COREG) 6.25 MG tablet Take 1 tablet (6.25 mg total) by mouth 2 (two) times daily.  . Cholecalciferol (VITAMIN D-3 PO) Take 1 capsule by mouth daily.  . clopidogrel (PLAVIX) 75 MG tablet Take 1 tablet (75 mg total) by mouth daily.  . Insulin Glargine (LANTUS SOLOSTAR) 100 UNIT/ML Solostar Pen Inject 7-8 Units into the skin every 12 (twelve) hours.   Marland Kitchen losartan (COZAAR) 100 MG tablet Take 1 tablet (100 mg total) by mouth daily.  . multivitamin (ONE-A-DAY MEN'S) TABS tablet Take 1 tablet by mouth daily.  . NONFORMULARY OR COMPOUNDED ITEM CPAP set to auto-titrate Please include CPAP supplies. Please allow patient to pick out mask  . NOVOLOG FLEXPEN 100 UNIT/ML FlexPen Inject 8 Units into the skin 3 (three) times daily with meals.  . sertraline (ZOLOFT) 50 MG tablet Take 1 tablet (50 mg total) by mouth daily.  . [DISCONTINUED] amLODipine (NORVASC) 10 MG tablet Take 1 tablet (10 mg total) by mouth daily.  . [DISCONTINUED] atorvastatin (LIPITOR) 80 MG tablet Take 80 mg by mouth daily.  . [DISCONTINUED] carvedilol (COREG) 3.125 MG tablet Take 1 tablet (3.125 mg total) by mouth 2 (two) times daily.  . [DISCONTINUED] clopidogrel (PLAVIX) 75 MG tablet Take 1 tablet (75 mg total) by mouth daily.  . [DISCONTINUED] losartan (COZAAR) 100 MG tablet Take 1 tablet (100 mg total) by mouth daily.  . [DISCONTINUED] sertraline (ZOLOFT) 50 MG tablet Take 1 tablet (50 mg total) by mouth daily.    No  Known Allergies     Review of Systems:  Constitutional: No recent illness  HEENT: No  headache, no vision change  Cardiac: No  chest pain, No  pressure, No palpitations  Respiratory:  No  shortness of breath. No  Cough  Musculoskeletal: No new myalgia/arthralgia  Neurologic: No  weakness, No  Dizziness  Psychiatric: No  concerns with depression, No  concerns with anxiety  Exam:  BP 137/79 (BP Location: Left Arm, Patient Position: Sitting, Cuff Size: Large)   Pulse 77   Temp 98.7 F (37.1 C) (Oral)   Wt 248 lb 1.9 oz (112.5 kg)   BMI 36.64 kg/m   Constitutional: VS see above. General Appearance: alert, well-developed, well-nourished, NAD  Eyes: Normal lids and conjunctive, non-icteric sclera  Ears, Nose, Mouth, Throat:  MMM, Normal external inspection ears/nares/mouth/lips/gums.  Neck: No masses, trachea midline.   Respiratory: Normal respiratory effort. no wheeze, no rhonchi, no rales  Cardiovascular: S1/S2 normal, no murmur, no rub/gallop auscultated. RRR.   Neurological: Normal balance/coordination. No tremor.  Skin: warm, dry, intact.   Psychiatric: Normal judgment/insight. Normal mood and affect. Oriented x3.       Visit summary with medication list and pertinent instructions was printed for patient to review, patient was advised to alert [place] if any updates are needed. All questions at time of visit were answered - patient instructed to contact office with any additional concerns. ER/RTC precautions were reviewed with the patient and understanding verbalized.   Note: Total time spent 25 minutes, greater than 50% of the visit was spent face-to-face counseling and coordinating care for the following: The primary encounter diagnosis was Type 1 diabetes mellitus with complications (HCC). 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 Please contact Dr. Lyn Hollingshead for any needed clarifications.    Follow up plan: Return in about 6 months (around 08/31/2019) for Geraldine (call week prior to visit for lab orders).

## 2019-03-04 LAB — CBC
HCT: 41.7 % (ref 38.5–50.0)
Hemoglobin: 13.7 g/dL (ref 13.2–17.1)
MCH: 30.3 pg (ref 27.0–33.0)
MCHC: 32.9 g/dL (ref 32.0–36.0)
MCV: 92.3 fL (ref 80.0–100.0)
MPV: 11.1 fL (ref 7.5–12.5)
Platelets: 194 10*3/uL (ref 140–400)
RBC: 4.52 10*6/uL (ref 4.20–5.80)
RDW: 13.1 % (ref 11.0–15.0)
WBC: 5.9 10*3/uL (ref 3.8–10.8)

## 2019-03-04 LAB — LIPID PANEL
Cholesterol: 176 mg/dL (ref <200)
HDL: 76 mg/dL (ref >=40)
LDL Cholesterol (Calc): 86 mg/dL (calc)
Non-HDL Cholesterol (Calc): 100 mg/dL (calc) (ref <130)
Total CHOL/HDL Ratio: 2.3 (calc) (ref <5.0)
Triglycerides: 46 mg/dL (ref <150)

## 2019-03-04 LAB — COMPLETE METABOLIC PANEL WITH GFR
AG Ratio: 1.3 (calc) (ref 1.0–2.5)
ALT: 17 U/L (ref 9–46)
AST: 18 U/L (ref 10–35)
Albumin: 3.8 g/dL (ref 3.6–5.1)
Alkaline phosphatase (APISO): 80 U/L (ref 35–144)
BUN: 23 mg/dL (ref 7–25)
CO2: 26 mmol/L (ref 20–32)
Calcium: 9.4 mg/dL (ref 8.6–10.3)
Chloride: 105 mmol/L (ref 98–110)
Creat: 0.93 mg/dL (ref 0.70–1.33)
GFR, Est African American: 106 mL/min/{1.73_m2} (ref >=60)
GFR, Est Non African American: 91 mL/min/{1.73_m2} (ref >=60)
Globulin: 3 g/dL (calc) (ref 1.9–3.7)
Glucose, Bld: 150 mg/dL — ABNORMAL HIGH (ref 65–99)
Potassium: 4.3 mmol/L (ref 3.5–5.3)
Sodium: 140 mmol/L (ref 135–146)
Total Bilirubin: 0.6 mg/dL (ref 0.2–1.2)
Total Protein: 6.8 g/dL (ref 6.1–8.1)

## 2019-03-27 DIAGNOSIS — E1042 Type 1 diabetes mellitus with diabetic polyneuropathy: Secondary | ICD-10-CM | POA: Diagnosis not present

## 2019-03-30 DIAGNOSIS — E103511 Type 1 diabetes mellitus with proliferative diabetic retinopathy with macular edema, right eye: Secondary | ICD-10-CM | POA: Diagnosis not present

## 2019-03-30 DIAGNOSIS — H3582 Retinal ischemia: Secondary | ICD-10-CM | POA: Diagnosis not present

## 2019-03-30 DIAGNOSIS — E103592 Type 1 diabetes mellitus with proliferative diabetic retinopathy without macular edema, left eye: Secondary | ICD-10-CM | POA: Diagnosis not present

## 2019-03-30 DIAGNOSIS — H35033 Hypertensive retinopathy, bilateral: Secondary | ICD-10-CM | POA: Diagnosis not present

## 2019-03-31 DIAGNOSIS — E10649 Type 1 diabetes mellitus with hypoglycemia without coma: Secondary | ICD-10-CM | POA: Diagnosis not present

## 2019-03-31 DIAGNOSIS — E1042 Type 1 diabetes mellitus with diabetic polyneuropathy: Secondary | ICD-10-CM | POA: Diagnosis not present

## 2019-06-16 DIAGNOSIS — I6930 Unspecified sequelae of cerebral infarction: Secondary | ICD-10-CM | POA: Diagnosis not present

## 2019-06-16 DIAGNOSIS — I10 Essential (primary) hypertension: Secondary | ICD-10-CM | POA: Diagnosis not present

## 2019-06-16 DIAGNOSIS — R531 Weakness: Secondary | ICD-10-CM | POA: Diagnosis not present

## 2019-06-16 DIAGNOSIS — E1069 Type 1 diabetes mellitus with other specified complication: Secondary | ICD-10-CM | POA: Diagnosis not present

## 2019-06-23 ENCOUNTER — Other Ambulatory Visit: Payer: Self-pay

## 2019-06-23 DIAGNOSIS — I152 Hypertension secondary to endocrine disorders: Secondary | ICD-10-CM

## 2019-06-23 DIAGNOSIS — E1159 Type 2 diabetes mellitus with other circulatory complications: Secondary | ICD-10-CM

## 2019-06-23 NOTE — Progress Notes (Signed)
OPENED IN ERROR

## 2019-08-07 ENCOUNTER — Telehealth: Payer: Self-pay | Admitting: Osteopathic Medicine

## 2019-08-07 NOTE — Telephone Encounter (Signed)
Received some paperwork from [facility] regarding disability claim.  Patient needs to have a visit in office to complete this paperwork, we can go ahead and release the records that they are requesting but paperwork requires a detailed physical exam.

## 2019-08-08 NOTE — Telephone Encounter (Signed)
Scheduled on 4/20

## 2019-08-15 ENCOUNTER — Other Ambulatory Visit: Payer: Self-pay

## 2019-08-15 ENCOUNTER — Encounter: Payer: Self-pay | Admitting: Osteopathic Medicine

## 2019-08-15 ENCOUNTER — Ambulatory Visit (INDEPENDENT_AMBULATORY_CARE_PROVIDER_SITE_OTHER): Payer: Federal, State, Local not specified - PPO | Admitting: Osteopathic Medicine

## 2019-08-15 VITALS — BP 135/82 | HR 89 | Temp 98.2°F | Wt 252.1 lb

## 2019-08-15 DIAGNOSIS — Z0000 Encounter for general adult medical examination without abnormal findings: Secondary | ICD-10-CM

## 2019-08-15 NOTE — Patient Instructions (Addendum)
Continue follow-up with endocrinology  You and I can see each other every 6 months, or as needed!   General Preventive Care  Most recent routine screening labs: done 02/2019.   Everyone should have blood pressure checked once per year. Goal 130/80 or less.   Tobacco: don't!   Alcohol: responsible moderation is ok for most adults - if you have concerns about your alcohol intake, please talk to me!   Exercise: as tolerated to reduce risk of cardiovascular disease and diabetes. Strength training will also prevent osteoporosis.   Mental health: if need for mental health care (medicines, counseling, other), or concerns about moods, please let me know!   Sexual health: if need for STD testing, or if concerns with libido/pain problems, please let me know!   Advanced Directive: Living Will and/or Healthcare Power of Attorney recommended for all adults, regardless of age or health.  Vaccines  Flu vaccine: for almost everyone, every fall.   Shingles vaccine: after age 20.   Pneumonia vaccines: after age 49, sooner for diabetics (check with your endocrinologist but our records show you're up to date)/   Tetanus booster: every 10 years   COVID vaccine: thanks for getting your shot!  Cancer screenings   Colon cancer screening: for everyone age 22-75. Colonoscopy available for all, many people also qualify for the Cologuard stool test   Prostate cancer screening: PSA blood test age 74-71  Lung cancer screening: not needed if no smoking history  Infection screenings  . HIV: recommended screening at least once age 56-65, . Gonorrhea/Chlamydia: screening as needed, . Hepatitis C: recommended once for everyone age 55-75 . TB: certain at-risk populations, or depending on work requirements and/or travel history Other . Bone Density Test: recommended for men at age 87, sooner depending on risk factors . Abdominal Aortic Aneurysm not needed if no smoking history

## 2019-08-15 NOTE — Progress Notes (Signed)
Nicholas Hoover is a 59 y.o. male who presents to  [facility] at [facility]  today, 08/15/19, seeking care for the following: . Physical . Disability paperwork filled out      ASSESSMENT & PLAN with other pertinent history/findings:  The encounter diagnosis was Annual physical exam.  Needs disability forms completed   .BP 135/82 (BP Location: Left Arm, Patient Position: Sitting, Cuff Size: Large)   Pulse 89   Temp 98.2 F (36.8 C) (Oral)   Wt 252 lb 1.3 oz (114.3 kg)   BMI 37.23 kg/m   General Preventive Care  Most recent routine screening labs: done 02/2019.   Everyone should have blood pressure checked once per year. Goal 130/80 or less.   Tobacco: don't!   Alcohol: responsible moderation is ok for most adults - if you have concerns about your alcohol intake, please talk to me!   Exercise: as tolerated to reduce risk of cardiovascular disease and diabetes. Strength training will also prevent osteoporosis.   Mental health: if need for mental health care (medicines, counseling, other), or concerns about moods, please let me know!   Sexual health: if need for STD testing, or if concerns with libido/pain problems, please let me know!   Advanced Directive: Living Will and/or Healthcare Power of Attorney recommended for all adults, regardless of age or health.  Vaccines  Flu vaccine: for almost everyone, every fall.   Shingles vaccine: after age 73.   Pneumonia vaccines: after age 35, sooner for diabetics (check with your endocrinologist but our records show you're up to date)/   Tetanus booster: every 10 years   COVID vaccine: thanks for getting your shot!  Cancer screenings   Colon cancer screening: for everyone age 40-75. Colonoscopy available for all, many people also qualify for the Cologuard stool test   Prostate cancer screening: PSA blood test age 34-71  Lung cancer screening: not needed if no smoking history   Infection screenings  . HIV: recommended screening at least once age 42-65, . Gonorrhea/Chlamydia: screening as needed, . Hepatitis C: recommended once for everyone age 50-75 . TB: certain at-risk populations, or depending on work requirements and/or travel history Other . Bone Density Test: recommended for men at age 31, sooner depending on risk factors . Abdominal Aortic Aneurysm not needed if no smoking history    BP 135/82 (BP Location: Left Arm, Patient Position: Sitting, Cuff Size: Large)   Pulse 89   Temp 98.2 F (36.8 C) (Oral)   Wt 252 lb 1.3 oz (114.3 kg)   BMI 37.23 kg/m   Constitutional:  . VSS, see nurse notes . General Appearance: alert, well-developed, well-nourished, NAD Eyes: Marland Kitchen Normal lids and conjunctive, non-icteric sclera . PERRLA Ears, Nose, Mouth, Throat: . Normal external auditory canal and TM bilaterally Neck: . No masses, trachea midline . No thyroid enlargement/tenderness/mass appreciated Respiratory: . Normal respiratory effort . No dullness/hyper-resonance to percussion . Breath sounds normal, no wheeze/rhonchi/rales Cardiovascular: . S1/S2 normal, no murmur/rub/gallop auscultated . No lower extremity edema Gastrointestinal: . Nontender, no masses Musculoskeletal:  . Gait antalgic d/t CVA residual effects . No clubbing/cyanosis of digits Neurological: . No cranial nerve deficit on limited exam . Motor reduced on R side (stable) both upper an dlower extremity  Psychiatric: . Normal judgment/insight . Normal mood and affect     No orders of the defined types were placed in this encounter.   No orders of the defined types were placed in this encounter.  Follow-up instructions: Return in about 6 months (around 02/14/2020) for IN-OFFICE VISIT, ROUTINE CHECK-UP, MONITOR BLOOD PRESSURE (call week prior to visit for lab orders).                                         BP 135/82 (BP Location:  Left Arm, Patient Position: Sitting, Cuff Size: Large)   Pulse 89   Temp 98.2 F (36.8 C) (Oral)   Wt 252 lb 1.3 oz (114.3 kg)   BMI 37.23 kg/m   Current Meds  Medication Sig  . amLODipine (NORVASC) 10 MG tablet Take 1 tablet (10 mg total) by mouth daily.  Marland Kitchen atorvastatin (LIPITOR) 80 MG tablet Take 1 tablet (80 mg total) by mouth daily.  . carvedilol (COREG) 6.25 MG tablet Take 1 tablet (6.25 mg total) by mouth 2 (two) times daily.  . Cholecalciferol (VITAMIN D-3 PO) Take 1 capsule by mouth daily.  . clopidogrel (PLAVIX) 75 MG tablet Take 1 tablet (75 mg total) by mouth daily.  . Insulin Glargine (LANTUS SOLOSTAR) 100 UNIT/ML Solostar Pen Inject 7-8 Units into the skin every 12 (twelve) hours.   Marland Kitchen losartan (COZAAR) 100 MG tablet Take 1 tablet (100 mg total) by mouth daily.  . multivitamin (ONE-A-DAY MEN'S) TABS tablet Take 1 tablet by mouth daily.  . NONFORMULARY OR COMPOUNDED ITEM CPAP set to auto-titrate Please include CPAP supplies. Please allow patient to pick out mask  . NOVOLOG FLEXPEN 100 UNIT/ML FlexPen Inject 8 Units into the skin 3 (three) times daily with meals.  . sertraline (ZOLOFT) 50 MG tablet Take 1 tablet (50 mg total) by mouth daily.    No results found for this or any previous visit (from the past 72 hour(s)).  No results found.  Depression screen [facility] 2/9 08/15/2019 03/03/2019 09/01/2018  Decreased Interest 0 0 0  Down, Depressed, Hopeless 0 0 1  PHQ - 2 Score 0 0 1  Altered sleeping 0 3 -  Tired, decreased energy 1 1 -  Change in appetite 0 0 -  Feeling bad or failure about yourself  0 0 -  Trouble concentrating 0 0 -  Moving slowly or fidgety/restless 0 0 -  Suicidal thoughts 0 0 -  PHQ-9 Score 1 4 -  Difficult doing work/chores Somewhat difficult Not difficult at all -    GAD 7 : Generalized Anxiety Score 08/15/2019 03/03/2019  Nervous, Anxious, on Edge 1 0  Control/stop worrying 0 0  Worry too much - different things 0 0  Trouble relaxing 0 0  Restless  0 0  Easily annoyed or irritable 0 0  Afraid - awful might happen 0 0  Total GAD 7 Score 1 0  Anxiety Difficulty Not difficult at all -      All questions at time of visit were answered - patient instructed to contact office with any additional concerns or updates.  ER/RTC precautions were reviewed with the patient.  Please note: voice recognition software was used to produce this document, and typos may escape review. Please contact Dr. Sheppard Coil for any needed clarifications.

## 2019-09-28 DIAGNOSIS — H2513 Age-related nuclear cataract, bilateral: Secondary | ICD-10-CM | POA: Diagnosis not present

## 2019-09-28 DIAGNOSIS — H35033 Hypertensive retinopathy, bilateral: Secondary | ICD-10-CM | POA: Diagnosis not present

## 2019-09-28 DIAGNOSIS — H3582 Retinal ischemia: Secondary | ICD-10-CM | POA: Diagnosis not present

## 2019-09-28 DIAGNOSIS — E103593 Type 1 diabetes mellitus with proliferative diabetic retinopathy without macular edema, bilateral: Secondary | ICD-10-CM | POA: Diagnosis not present

## 2019-09-29 DIAGNOSIS — E1042 Type 1 diabetes mellitus with diabetic polyneuropathy: Secondary | ICD-10-CM | POA: Diagnosis not present

## 2019-09-29 DIAGNOSIS — E10649 Type 1 diabetes mellitus with hypoglycemia without coma: Secondary | ICD-10-CM | POA: Diagnosis not present

## 2019-09-29 LAB — HEMOGLOBIN A1C
Hemoglobin A1C: 6.6
Hemoglobin A1C: 6.6
Hemoglobin A1C: 6.6

## 2019-10-16 ENCOUNTER — Other Ambulatory Visit: Payer: Self-pay | Admitting: Physician Assistant

## 2019-10-16 DIAGNOSIS — I152 Hypertension secondary to endocrine disorders: Secondary | ICD-10-CM

## 2019-10-16 DIAGNOSIS — E1159 Type 2 diabetes mellitus with other circulatory complications: Secondary | ICD-10-CM

## 2019-10-26 DIAGNOSIS — E103592 Type 1 diabetes mellitus with proliferative diabetic retinopathy without macular edema, left eye: Secondary | ICD-10-CM | POA: Diagnosis not present

## 2019-12-25 DIAGNOSIS — E1069 Type 1 diabetes mellitus with other specified complication: Secondary | ICD-10-CM | POA: Diagnosis not present

## 2019-12-25 DIAGNOSIS — I6930 Unspecified sequelae of cerebral infarction: Secondary | ICD-10-CM | POA: Diagnosis not present

## 2019-12-25 DIAGNOSIS — G8191 Hemiplegia, unspecified affecting right dominant side: Secondary | ICD-10-CM | POA: Diagnosis not present

## 2019-12-25 DIAGNOSIS — I10 Essential (primary) hypertension: Secondary | ICD-10-CM | POA: Diagnosis not present

## 2020-01-25 DIAGNOSIS — E103592 Type 1 diabetes mellitus with proliferative diabetic retinopathy without macular edema, left eye: Secondary | ICD-10-CM | POA: Diagnosis not present

## 2020-01-25 DIAGNOSIS — H3582 Retinal ischemia: Secondary | ICD-10-CM | POA: Diagnosis not present

## 2020-01-25 DIAGNOSIS — H35033 Hypertensive retinopathy, bilateral: Secondary | ICD-10-CM | POA: Diagnosis not present

## 2020-01-25 DIAGNOSIS — E103511 Type 1 diabetes mellitus with proliferative diabetic retinopathy with macular edema, right eye: Secondary | ICD-10-CM | POA: Diagnosis not present

## 2020-02-14 ENCOUNTER — Encounter: Payer: Self-pay | Admitting: Osteopathic Medicine

## 2020-02-14 ENCOUNTER — Ambulatory Visit (INDEPENDENT_AMBULATORY_CARE_PROVIDER_SITE_OTHER): Payer: Federal, State, Local not specified - PPO | Admitting: Osteopathic Medicine

## 2020-02-14 VITALS — BP 129/79 | HR 82 | Temp 98.1°F | Wt 255.1 lb

## 2020-02-14 DIAGNOSIS — E1159 Type 2 diabetes mellitus with other circulatory complications: Secondary | ICD-10-CM | POA: Diagnosis not present

## 2020-02-14 DIAGNOSIS — F4321 Adjustment disorder with depressed mood: Secondary | ICD-10-CM

## 2020-02-14 DIAGNOSIS — E108 Type 1 diabetes mellitus with unspecified complications: Secondary | ICD-10-CM | POA: Diagnosis not present

## 2020-02-14 DIAGNOSIS — R21 Rash and other nonspecific skin eruption: Secondary | ICD-10-CM

## 2020-02-14 DIAGNOSIS — I152 Hypertension secondary to endocrine disorders: Secondary | ICD-10-CM

## 2020-02-14 MED ORDER — CLOTRIMAZOLE-BETAMETHASONE 1-0.05 % EX CREA: 1.0000 "application " | TOPICAL_CREAM | Freq: Three times a day (TID) | CUTANEOUS | 0 refills | Status: DC

## 2020-02-14 MED ORDER — ATORVASTATIN CALCIUM 80 MG PO TABS: 80.0000 mg | ORAL_TABLET | Freq: Every day | ORAL | 3 refills | Status: DC

## 2020-02-14 MED ORDER — AMLODIPINE BESYLATE 10 MG PO TABS: 10.0000 mg | ORAL_TABLET | Freq: Every day | ORAL | 3 refills | Status: DC

## 2020-02-14 MED ORDER — SERTRALINE HCL 50 MG PO TABS: 50.0000 mg | ORAL_TABLET | Freq: Every day | ORAL | 3 refills | Status: DC

## 2020-02-14 MED ORDER — CLOPIDOGREL BISULFATE 75 MG PO TABS: 75.0000 mg | ORAL_TABLET | Freq: Every day | ORAL | 3 refills | Status: DC

## 2020-02-14 MED ORDER — CARVEDILOL 6.25 MG PO TABS: 6.2500 mg | ORAL_TABLET | Freq: Two times a day (BID) | ORAL | 3 refills | Status: DC

## 2020-02-14 MED ORDER — LOSARTAN POTASSIUM 100 MG PO TABS: 100.0000 mg | ORAL_TABLET | Freq: Every day | ORAL | 3 refills | Status: DC

## 2020-02-14 NOTE — Progress Notes (Signed)
Nicholas Hoover is a 59 y.o. male who presents to  [facility] at [facility]  today, 02/14/20, seeking care for the following:  . Routine check-up chronic issues - see below  . New problem - rash on R forearm and L neck - hyperpigmented patchy scaly, pt reports itching, present several months, OTC Rx unsure name not helpful      ASSESSMENT & PLAN with other pertinent findings:  The primary encounter diagnosis was Rash. Diagnoses of Hypertension associated with diabetes (HCC), Situational depression, and Type 1 diabetes mellitus with complications (HCC) were also pertinent to this visit.   No results found for this or any previous visit (from the past 24 hour(s)).  Rash seems c/w fungal infection, advised continue Rx for at least a month unless worse. If worse/no better would Bx for confirmation of dx   DM1 following w/ endocrine   Mental health - stable, refilled Zoloft   BP - stable  BP Readings from Last 3 Encounters:  02/14/20 129/79  08/15/19 135/82  03/03/19 137/79     There are no Patient Instructions on file for this visit.  Orders Placed This Encounter  Procedures  . Hemoglobin A1c  . Hemoglobin A1c  . Hemoglobin A1c    Meds ordered this encounter  Medications  . sertraline (ZOLOFT) 50 MG tablet    Sig: Take 1 tablet (50 mg total) by mouth daily.    Dispense:  90 tablet    Refill:  3  . losartan (COZAAR) 100 MG tablet    Sig: Take 1 tablet (100 mg total) by mouth daily.    Dispense:  90 tablet    Refill:  3  . clopidogrel (PLAVIX) 75 MG tablet    Sig: Take 1 tablet (75 mg total) by mouth daily.    Dispense:  90 tablet    Refill:  3  . atorvastatin (LIPITOR) 80 MG tablet    Sig: Take 1 tablet (80 mg total) by mouth daily.    Dispense:  90 tablet    Refill:  3  . amLODipine (NORVASC) 10 MG tablet    Sig: Take 1 tablet (10 mg total) by mouth daily.    Dispense:  90 tablet    Refill:  3  . carvedilol (COREG)  6.25 MG tablet    Sig: Take 1 tablet (6.25 mg total) by mouth 2 (two) times daily.    Dispense:  180 tablet    Refill:  3  . clotrimazole-betamethasone (LOTRISONE) cream    Sig: Apply 1 application topically in the morning, at noon, and at bedtime. Continue for 2 weeks after rash improves / resolves    Dispense:  90 g    Refill:  0       Follow-up instructions: Return in about 6 months (around 08/14/2020) for ANNUAL (call week prior to visit for lab orders).                                         BP 129/79 (BP Location: Left Arm, Patient Position: Sitting, Cuff Size: Normal)   Pulse 82   Temp 98.1 F (36.7 C) (Oral)   Wt 255 lb 1.9 oz (115.7 kg)   BMI 37.67 kg/m   Current Meds  Medication Sig  . amLODipine (NORVASC) 10 MG tablet Take 1 tablet (10 mg total) by mouth daily.  Marland Kitchen atorvastatin (  LIPITOR) 80 MG tablet Take 1 tablet (80 mg total) by mouth daily.  . carvedilol (COREG) 6.25 MG tablet Take 1 tablet (6.25 mg total) by mouth 2 (two) times daily.  . Cholecalciferol (VITAMIN D-3 PO) Take 1 capsule by mouth daily.  . clopidogrel (PLAVIX) 75 MG tablet Take 1 tablet (75 mg total) by mouth daily.  . Insulin Glargine (LANTUS SOLOSTAR) 100 UNIT/ML Solostar Pen Inject 7-8 Units into the skin every 12 (twelve) hours.   Marland Kitchen losartan (COZAAR) 100 MG tablet Take 1 tablet (100 mg total) by mouth daily.  . multivitamin (ONE-A-DAY MEN'S) TABS tablet Take 1 tablet by mouth daily.  . NONFORMULARY OR COMPOUNDED ITEM CPAP set to auto-titrate Please include CPAP supplies. Please allow patient to pick out mask  . NOVOLOG FLEXPEN 100 UNIT/ML FlexPen Inject 8 Units into the skin 3 (three) times daily with meals.  . sertraline (ZOLOFT) 50 MG tablet Take 1 tablet (50 mg total) by mouth daily.  . [DISCONTINUED] amLODipine (NORVASC) 10 MG tablet Take 1 tablet (10 mg total) by mouth daily.  . [DISCONTINUED] atorvastatin (LIPITOR) 80 MG tablet Take 1 tablet (80 mg  total) by mouth daily.  . [DISCONTINUED] carvedilol (COREG) 3.125 MG tablet TAKE 1 TABLET BY MOUTH TWICE DAILY  . [DISCONTINUED] carvedilol (COREG) 6.25 MG tablet Take 1 tablet (6.25 mg total) by mouth 2 (two) times daily.  . [DISCONTINUED] clopidogrel (PLAVIX) 75 MG tablet Take 1 tablet (75 mg total) by mouth daily.  . [DISCONTINUED] losartan (COZAAR) 100 MG tablet Take 1 tablet (100 mg total) by mouth daily.  . [DISCONTINUED] sertraline (ZOLOFT) 50 MG tablet Take 1 tablet (50 mg total) by mouth daily.    No results found for this or any previous visit (from the past 72 hour(s)).  No results found.     All questions at time of visit were answered - patient instructed to contact office with any additional concerns or updates.  ER/RTC precautions were reviewed with the patient as applicable.   Please note: voice recognition software was used to produce this document, and typos may escape review. Please contact Dr. Lyn Hollingshead for any needed clarifications.

## 2020-04-02 DIAGNOSIS — E1042 Type 1 diabetes mellitus with diabetic polyneuropathy: Secondary | ICD-10-CM | POA: Diagnosis not present

## 2020-04-02 DIAGNOSIS — E10649 Type 1 diabetes mellitus with hypoglycemia without coma: Secondary | ICD-10-CM | POA: Diagnosis not present

## 2020-05-02 DIAGNOSIS — H35031 Hypertensive retinopathy, right eye: Secondary | ICD-10-CM | POA: Diagnosis not present

## 2020-05-02 DIAGNOSIS — H3582 Retinal ischemia: Secondary | ICD-10-CM | POA: Diagnosis not present

## 2020-05-02 DIAGNOSIS — E103592 Type 1 diabetes mellitus with proliferative diabetic retinopathy without macular edema, left eye: Secondary | ICD-10-CM | POA: Diagnosis not present

## 2020-05-02 DIAGNOSIS — E103511 Type 1 diabetes mellitus with proliferative diabetic retinopathy with macular edema, right eye: Secondary | ICD-10-CM | POA: Diagnosis not present

## 2020-06-26 DIAGNOSIS — I10 Essential (primary) hypertension: Secondary | ICD-10-CM | POA: Diagnosis not present

## 2020-06-26 DIAGNOSIS — G8191 Hemiplegia, unspecified affecting right dominant side: Secondary | ICD-10-CM | POA: Diagnosis not present

## 2020-06-26 DIAGNOSIS — I6930 Unspecified sequelae of cerebral infarction: Secondary | ICD-10-CM | POA: Diagnosis not present

## 2020-06-26 DIAGNOSIS — E1069 Type 1 diabetes mellitus with other specified complication: Secondary | ICD-10-CM | POA: Diagnosis not present

## 2020-07-03 ENCOUNTER — Other Ambulatory Visit: Payer: Self-pay | Admitting: Osteopathic Medicine

## 2020-07-03 DIAGNOSIS — I152 Hypertension secondary to endocrine disorders: Secondary | ICD-10-CM

## 2020-07-03 DIAGNOSIS — E1159 Type 2 diabetes mellitus with other circulatory complications: Secondary | ICD-10-CM

## 2020-08-14 ENCOUNTER — Other Ambulatory Visit: Payer: Self-pay

## 2020-08-14 ENCOUNTER — Other Ambulatory Visit: Payer: Self-pay | Admitting: Osteopathic Medicine

## 2020-08-14 ENCOUNTER — Encounter: Payer: Self-pay | Admitting: Osteopathic Medicine

## 2020-08-14 ENCOUNTER — Ambulatory Visit (INDEPENDENT_AMBULATORY_CARE_PROVIDER_SITE_OTHER): Payer: Federal, State, Local not specified - PPO | Admitting: Osteopathic Medicine

## 2020-08-14 VITALS — BP 128/69 | HR 75 | Temp 98.0°F | Wt 256.1 lb

## 2020-08-14 DIAGNOSIS — L989 Disorder of the skin and subcutaneous tissue, unspecified: Secondary | ICD-10-CM

## 2020-08-14 DIAGNOSIS — E559 Vitamin D deficiency, unspecified: Secondary | ICD-10-CM

## 2020-08-14 DIAGNOSIS — E1159 Type 2 diabetes mellitus with other circulatory complications: Secondary | ICD-10-CM

## 2020-08-14 DIAGNOSIS — Z0000 Encounter for general adult medical examination without abnormal findings: Secondary | ICD-10-CM

## 2020-08-14 DIAGNOSIS — L409 Psoriasis, unspecified: Secondary | ICD-10-CM

## 2020-08-14 DIAGNOSIS — L308 Other specified dermatitis: Secondary | ICD-10-CM

## 2020-08-14 DIAGNOSIS — I152 Hypertension secondary to endocrine disorders: Secondary | ICD-10-CM

## 2020-08-14 DIAGNOSIS — Z8673 Personal history of transient ischemic attack (TIA), and cerebral infarction without residual deficits: Secondary | ICD-10-CM | POA: Diagnosis not present

## 2020-08-14 DIAGNOSIS — G4733 Obstructive sleep apnea (adult) (pediatric): Secondary | ICD-10-CM

## 2020-08-14 DIAGNOSIS — Z125 Encounter for screening for malignant neoplasm of prostate: Secondary | ICD-10-CM

## 2020-08-14 DIAGNOSIS — Z7185 Encounter for immunization safety counseling: Secondary | ICD-10-CM

## 2020-08-14 DIAGNOSIS — F4321 Adjustment disorder with depressed mood: Secondary | ICD-10-CM

## 2020-08-14 DIAGNOSIS — Z1211 Encounter for screening for malignant neoplasm of colon: Secondary | ICD-10-CM

## 2020-08-14 DIAGNOSIS — E108 Type 1 diabetes mellitus with unspecified complications: Secondary | ICD-10-CM | POA: Diagnosis not present

## 2020-08-14 NOTE — Progress Notes (Signed)
Nicholas Hoover is a 60 y.o. male who presents to  [place] at [facility]  today, 08/14/20, seeking care for the following:  . Annual physical . Abnormal rash, persistent greater than 6 months.  Previously treated with antifungal/steroid medication which resulted in some improvement but not resolution.  Patient still states rash is quite itchy.  Requesting alternative treatment.         ASSESSMENT & PLAN with other pertinent findings:  The primary encounter diagnosis was Annual physical exam. Diagnoses of Hypertension associated with diabetes ([place]), Type 1 diabetes mellitus with complications ([place]) - following w/ endocrinology, History of multiple strokes - on Plavix, Lipitor , Situational depression, Vitamin D deficiency, Prostate cancer screening, Colon cancer screening, Vaccine counseling, Obstructive sleep apnea - using and benefitting from CPAP, and Skin lesion were also pertinent to this visit.     Patient Instructions  General Preventive Care  Most recent routine screening labs: ordered today.   Blood pressure goal 130/80 or less.   Tobacco: don't!   Alcohol: responsible moderation is ok for most adults - if you have concerns about your alcohol intake, please talk to me!   Exercise: as tolerated to reduce risk of cardiovascular disease and diabetes. Strength training will also prevent osteoporosis.   Mental health: if need for mental health care (medicines, counseling, other), or concerns about moods, please let me know!   Sexual / Reproductive health: if need for STD testing, or if concerns with libido/pain problems, please let me know! If you need to discuss family planning, please let me know!   Advanced Directive: Living Will and/or Healthcare Power of Attorney recommended for all adults, regardless of age or health.  Vaccines  Flu vaccine: for almost everyone, every fall/winter  Shingles vaccine: after age 71.    Pneumonia vaccines: after age 47  Tetanus booster: every 10 years - due 2023  COVID vaccine: THANKS for getting your vaccine! :)  Cancer screenings   Colon cancer screening: for everyone age 60-75. Due 10 years after normal colonoscopy. If abnormal colonoscopy, follow up as directed   Prostate cancer screening: PSA blood test age 15-71  Lung cancer screening: not needed for non-smokers  Infection screenings  . HIV: recommended screening at least once age 69-65, more often as needed. Rondell Reams, other STI: screening as needed. . Hepatitis C: recommended once for everyone age 90-75 . TB: certain at-risk populations, or depending on work requirements and/or travel history Other . Bone Density Test: recommended for men at age 60 . Abdominal Aortic Aneurysm: screening with ultrasound recommended once for men age 4-75 who have ever smoked    Orders Placed This Encounter  Procedures  . CBC  . CMP14+EGFR  . Lipid panel  . PSA Total (Reflex To Free)  . VITAMIN D 25 Hydroxy (Vit-D Deficiency, Fractures)  . CBC  . COMPLETE METABOLIC PANEL WITH GFR  . Lipid panel  . PSA, Total with Reflex to PSA, Free  . VITAMIN D 25 Hydroxy (Vit-D Deficiency, Fractures)    No orders of the defined types were placed in this encounter.   PRE-OP DIAGNOSIS: Abnormal Skin Lesion POST-OP DIAGNOSIS: Same  PROCEDURE: skin biopsy Performing Physician: Emeterio Reeve   PROCEDURE:  Punch (Size 4 mm)   The area surrounding the skin lesion was prepared and draped in the usual sterile manner. The lesion was removed in the usual manner by the biopsy method noted above. Hemostasis was assured. The patient tolerated the procedure well.  Closure:     suture x 1 horizontal mattress    Followup: The patient tolerated the procedure well without complications.  Standard post-procedure care is explained and return precautions are given.   See below for relevant physical exam findings  See  below for recent lab and imaging results reviewed  Medications, allergies, PMH, PSH, SocH, [place] reviewed below    Follow-up instructions: Return in about 1 year (around 08/14/2021) for [place] (call week prior to visit for lab orders). 10 DAYS FOR SUTURE REMOVAL .                                        Exam:  BP 128/69 (BP Location: Left Arm, Patient Position: Sitting, Cuff Size: Normal)   Pulse 75   Temp 98 F (36.7 C) (Oral)   Wt 256 lb 1.3 oz (116.2 kg)   BMI 37.82 kg/m   Constitutional: VS see above. General Appearance: alert, well-developed, well-nourished, NAD  Neck: No masses, trachea midline.   Respiratory: Normal respiratory effort. no wheeze, no rhonchi, no rales  Cardiovascular: S1/S2 normal, no murmur, no rub/gallop auscultated. RRR.   Musculoskeletal: Gait steady.  Chronic, stable weakness status post stroke in right arm  Abdominal: non-tender, non-distended, no appreciable organomegaly, neg Murphy's, BS WNLx4  Neurological: Normal balance/coordination. No tremor.  Skin: warm, dry, intact.   Psychiatric: Normal judgment/insight. Normal mood and affect. Oriented x3.   Current Meds  Medication Sig  . amLODipine (NORVASC) 10 MG tablet Take 1 tablet (10 mg total) by mouth daily.  Marland Kitchen atorvastatin (LIPITOR) 80 MG tablet Take 1 tablet (80 mg total) by mouth daily.  . carvedilol (COREG) 6.25 MG tablet Take 1 tablet (6.25 mg total) by mouth 2 (two) times daily.  . Cholecalciferol (VITAMIN D-3 PO) Take 1 capsule by mouth daily.  . clopidogrel (PLAVIX) 75 MG tablet Take 1 tablet (75 mg total) by mouth daily.  . insulin glargine (LANTUS) 100 UNIT/ML Solostar Pen Inject 7-8 Units into the skin every 12 (twelve) hours.   Marland Kitchen losartan (COZAAR) 100 MG tablet TAKE 1 TABLET(100 MG) BY MOUTH DAILY  . multivitamin (ONE-A-DAY MEN'S) TABS tablet Take 1 tablet by mouth daily.  . NONFORMULARY OR COMPOUNDED ITEM CPAP set to auto-titrate Please include  CPAP supplies. Please allow patient to pick out mask  . NOVOLOG FLEXPEN 100 UNIT/ML FlexPen Inject 8 Units into the skin 3 (three) times daily with meals.  . sertraline (ZOLOFT) 50 MG tablet Take 1 tablet (50 mg total) by mouth daily.  . [DISCONTINUED] clotrimazole-betamethasone (LOTRISONE) cream Apply 1 application topically in the morning, at noon, and at bedtime. Continue for 2 weeks after rash improves / resolves    No Known Allergies  Patient Active Problem List   Diagnosis Date Noted  . Nocturia more than twice per night 09/02/2018  . No blood products 04/06/2018  . 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 Hypoglycemia 04/06/2018  . 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 Diabetic mononeuropathy associated with type 1 diabetes mellitus ([place]) 04/06/2018  . Obstructive sleep apnea   . History of multiple strokes 04/01/2018  . Dysarthria 04/01/2018  . Hypertension associated with diabetes ([place]) 04/01/2018  . Type 1 diabetes mellitus with complications ([place]) 23/53/6144  . 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 Sarcoidosis 04/01/2018  . Situational depression 04/01/2018  . History of DVT of lower  extremity 04/27/2004    Family History  Problem Relation Age of Onset  . Cancer Mother   . Lung cancer Mother   . Heart attack Father   . Diabetes Sister   . Diabetes Brother   . Diabetes Sister     Social History   Tobacco Use  Smoking Status Never Smoker  Smokeless Tobacco Never Used    Past Surgical History:  Procedure Laterality Date  . VENTRICULAR ATRIAL SHUNT Right 2006   Procedure done at [facility]    Immunization History  Administered Date(s) Administered  . Moderna Sars-Covid-2 Vaccination 07/29/2019, 08/26/2019, 03/12/2020  . Pneumococcal Polysaccharide-23 05/08/2008, 04/06/2018  . Td 05/11/2001  . Tdap 06/30/2011    Recent Results (from the past 2160 hour(s))  CBC      Status: None   Collection Time: 08/14/20 12:00 AM  Result Value Ref Range   WBC 6.0 3.8 - 10.8 Thousand/uL   RBC 4.45 4.20 - 5.80 Million/uL   Hemoglobin 13.7 13.2 - 17.1 g/dL   HCT 41.6 38.5 - 50.0 %   MCV 93.5 80.0 - 100.0 fL   MCH 30.8 27.0 - 33.0 pg   MCHC 32.9 32.0 - 36.0 g/dL   RDW 13.1 11.0 - 15.0 %   Platelets 198 140 - 400 Thousand/uL   MPV 10.8 7.5 - 12.5 fL  COMPLETE METABOLIC PANEL WITH GFR     Status: Abnormal   Collection Time: 08/14/20 12:00 AM  Result Value Ref Range   Glucose, Bld 139 (H) 65 - 99 mg/dL    Comment: .            Fasting reference interval . For someone without known diabetes, a glucose value >125 mg/dL indicates that they may have diabetes and this should be confirmed with a follow-up test. .    BUN 24 7 - 25 mg/dL   Creat 0.97 0.70 - 1.33 mg/dL    Comment: For patients >22 years of age, the reference limit for Creatinine is approximately 13% higher for people identified as African-American. .    GFR, Est Non African American 86 > OR = 60 mL/min/1.82m   GFR, Est African American 100 > OR = 60 mL/min/1.750m  BUN/Creatinine Ratio NOT APPLICABLE 6 - 22 (calc)   Sodium 140 135 - 146 mmol/L   Potassium 4.8 3.5 - 5.3 mmol/L   Chloride 106 98 - 110 mmol/L   CO2 28 20 - 32 mmol/L   Calcium 9.4 8.6 - 10.3 mg/dL   Total Protein 6.6 6.1 - 8.1 g/dL   Albumin 4.0 3.6 - 5.1 g/dL   Globulin 2.6 1.9 - 3.7 g/dL (calc)   AG Ratio 1.5 1.0 - 2.5 (calc)   Total Bilirubin 0.5 0.2 - 1.2 mg/dL   Alkaline phosphatase (APISO) 92 35 - 144 U/L   AST 40 (H) 10 - 35 U/L   ALT 47 (H) 9 - 46 U/L  Lipid panel     Status: None   Collection Time: 08/14/20 12:00 AM  Result Value Ref Range   Cholesterol 124 <200 mg/dL   HDL 64 > OR = 40 mg/dL   Triglycerides 50 <150 mg/dL   LDL Cholesterol (Calc) 48 mg/dL (calc)    Comment: Reference range: <100 . Desirable range <100 mg/dL for primary prevention;   <70 mg/dL for patients with CHD or diabetic patients  with >  or = 2 CHD risk factors. . Marland KitchenDL-C is now calculated using the Martin-Hopkins  calculation, which is  a validated novel method providing  better accuracy than the Friedewald equation in the  estimation of LDL-C.  Cresenciano Genre et al. Annamaria Helling. 4650;354(65): 2061-2068  (http://education.[facility].com/faq/FAQ164)    Total CHOL/HDL Ratio 1.9 <5.0 (calc)   Non-HDL Cholesterol (Calc) 60 <130 mg/dL (calc)    Comment: For patients with diabetes plus 1 major ASCVD risk  factor, treating to a non-HDL-C goal of <100 mg/dL  (LDL-C of <70 mg/dL) is considered a therapeutic  option.   VITAMIN D 25 Hydroxy (Vit-D Deficiency, Fractures)     Status: None   Collection Time: 08/14/20 12:00 AM  Result Value Ref Range   Vit D, 25-Hydroxy 77 30 - 100 ng/mL    Comment: Vitamin D Status         25-OH Vitamin D: . Deficiency:                    <20 ng/mL Insufficiency:             20 - 29 ng/mL Optimal:                 > or = 30 ng/mL . For 25-OH Vitamin D testing on patients on  D2-supplementation and patients for whom quantitation  of D2 and D3 fractions is required, the Quest[facility]ureD(TM) 25-OH VIT D, (D2,D3), LC/MS/MS is recommended: order  code [id number] (patients >14yr). See Note 1 . Note 1 . For additional information, please refer to  http://education.[facility].com/faq/FAQ199  (This link is being provided for informational/ educational purposes only.)     No results found.     All questions at time of visit were answered - patient instructed to contact office with any additional concerns or updates. ER/RTC precautions were reviewed with the patient as applicable.   Please note: manual typing as well as voice recognition software may have been used to produce this document - typos may escape review. Please contact Dr. ASheppard Coilfor any needed clarifications.

## 2020-08-14 NOTE — Patient Instructions (Signed)
General Preventive Care  Most recent routine screening labs: ordered today.   Blood pressure goal 130/80 or less.   Tobacco: don't!   Alcohol: responsible moderation is ok for most adults - if you have concerns about your alcohol intake, please talk to me!   Exercise: as tolerated to reduce risk of cardiovascular disease and diabetes. Strength training will also prevent osteoporosis.   Mental health: if need for mental health care (medicines, counseling, other), or concerns about moods, please let me know!   Sexual / Reproductive health: if need for STD testing, or if concerns with libido/pain problems, please let me know! If you need to discuss family planning, please let me know!   Advanced Directive: Living Will and/or Healthcare Power of Attorney recommended for all adults, regardless of age or health.  Vaccines  Flu vaccine: for almost everyone, every fall/winter  Shingles vaccine: after age 61.   Pneumonia vaccines: after age 42  Tetanus booster: every 10 years - due 2023  COVID vaccine: THANKS for getting your vaccine! :)  Cancer screenings   Colon cancer screening: for everyone age 26-75. Due 10 years after normal colonoscopy. If abnormal colonoscopy, follow up as directed   Prostate cancer screening: PSA blood test age 81-71  Lung cancer screening: not needed for non-smokers  Infection screenings  . HIV: recommended screening at least once age 53-65, more often as needed. Nanetta Batty, other STI: screening as needed. . Hepatitis C: recommended once for everyone age 72-75 . TB: certain at-risk populations, or depending on work requirements and/or travel history Other . Bone Density Test: recommended for men at age 81 . Abdominal Aortic Aneurysm: screening with ultrasound recommended once for men age 42-75 who have ever smoked

## 2020-08-15 LAB — COMPLETE METABOLIC PANEL WITH GFR
AG Ratio: 1.5 (calc) (ref 1.0–2.5)
ALT: 47 U/L — ABNORMAL HIGH (ref 9–46)
AST: 40 U/L — ABNORMAL HIGH (ref 10–35)
Albumin: 4 g/dL (ref 3.6–5.1)
Alkaline phosphatase (APISO): 92 U/L (ref 35–144)
BUN: 24 mg/dL (ref 7–25)
CO2: 28 mmol/L (ref 20–32)
Calcium: 9.4 mg/dL (ref 8.6–10.3)
Chloride: 106 mmol/L (ref 98–110)
Creat: 0.97 mg/dL (ref 0.70–1.33)
GFR, Est African American: 100 mL/min/{1.73_m2} (ref >=60)
GFR, Est Non African American: 86 mL/min/{1.73_m2} (ref >=60)
Globulin: 2.6 g/dL (calc) (ref 1.9–3.7)
Glucose, Bld: 139 mg/dL — ABNORMAL HIGH (ref 65–99)
Potassium: 4.8 mmol/L (ref 3.5–5.3)
Sodium: 140 mmol/L (ref 135–146)
Total Bilirubin: 0.5 mg/dL (ref 0.2–1.2)
Total Protein: 6.6 g/dL (ref 6.1–8.1)

## 2020-08-15 LAB — CBC
HCT: 41.6 % (ref 38.5–50.0)
Hemoglobin: 13.7 g/dL (ref 13.2–17.1)
MCH: 30.8 pg (ref 27.0–33.0)
MCHC: 32.9 g/dL (ref 32.0–36.0)
MCV: 93.5 fL (ref 80.0–100.0)
MPV: 10.8 fL (ref 7.5–12.5)
Platelets: 198 10*3/uL (ref 140–400)
RBC: 4.45 10*6/uL (ref 4.20–5.80)
RDW: 13.1 % (ref 11.0–15.0)
WBC: 6 10*3/uL (ref 3.8–10.8)

## 2020-08-15 LAB — VITAMIN D 25 HYDROXY (VIT D DEFICIENCY, FRACTURES): Vit D, 25-Hydroxy: 77 ng/mL (ref 30–100)

## 2020-08-15 LAB — LIPID PANEL
Cholesterol: 124 mg/dL (ref <200)
HDL: 64 mg/dL (ref >=40)
LDL Cholesterol (Calc): 48 mg/dL (calc)
Non-HDL Cholesterol (Calc): 60 mg/dL (calc) (ref <130)
Total CHOL/HDL Ratio: 1.9 (calc) (ref <5.0)
Triglycerides: 50 mg/dL (ref <150)

## 2020-08-15 LAB — PSA, TOTAL WITH REFLEX TO PSA, FREE: PSA, Total: 1.1 ng/mL (ref <=4.0)

## 2020-08-23 ENCOUNTER — Other Ambulatory Visit: Payer: Self-pay

## 2020-08-23 ENCOUNTER — Ambulatory Visit (INDEPENDENT_AMBULATORY_CARE_PROVIDER_SITE_OTHER): Payer: Federal, State, Local not specified - PPO | Admitting: Family Medicine

## 2020-08-23 ENCOUNTER — Encounter: Payer: Self-pay | Admitting: Family Medicine

## 2020-08-23 VITALS — BP 141/65 | HR 86 | Temp 98.0°F | Wt 257.1 lb

## 2020-08-23 DIAGNOSIS — Z4802 Encounter for removal of sutures: Secondary | ICD-10-CM

## 2020-08-23 NOTE — Progress Notes (Signed)
Acute Office Visit  Subjective:    Patient ID: Nicholas Hoover, male    DOB: [birth year], 60 y.o.   MRN: [id number]  Chief Complaint  Patient presents with  . Suture / Staple Removal    HPI Patient is in today for suture removal.  Right forearm skin biopsy by PCP on 08/14/20 with 1 suture. Results pending. Here for suture removal. No concerns or complications.  Past Medical History:  Diagnosis Date  . Diabetes mellitus without complication (HCC)   . History of DVT of lower extremity 2006  . History of multiple strokes 04/01/2018  . HTN (hypertension) 04/01/2018  . 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 Obstructive sleep apnea   . Sarcoidosis 04/01/2018  . 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 Situational depression 04/01/2018  . Stroke (HCC)   . Type 1 diabetes mellitus with hyperlipidemia (HCC) 04/01/2018    Past Surgical History:  Procedure Laterality Date  . VENTRICULAR ATRIAL SHUNT Right 2006   Procedure done at [facility]    Family History  Problem Relation Age of Onset  . Cancer Mother   . Lung cancer Mother   . Heart attack Father   . Diabetes Sister   . Diabetes Brother   . Diabetes Sister     Social History   Socioeconomic History  . Marital status: Married    Spouse name: Not on file  . Number of children: Not on file  . Years of education: Not on file  . Highest education level: Not on file  Occupational History  . Not on file  Tobacco Use  . Smoking status: Never Smoker  . Smokeless tobacco: Never Used  Vaping Use  . Vaping Use: Never used  Substance and Sexual Activity  . Alcohol use: Not Currently  . Drug use: Never  . Sexual activity: Not Currently  Other Topics Concern  . Not on file  Social History Narrative  . Not on file   Social Determinants of Health   Financial Resource Strain: Not on file  Food Insecurity: Not on file  Transportation Needs: Not on file  Physical Activity: Not on  file  Stress: Not on file  Social Connections: Not on file  Intimate Partner Violence: Not on file    Outpatient Medications Prior to Visit  Medication Sig Dispense Refill  . amLODipine (NORVASC) 10 MG tablet Take 1 tablet (10 mg total) by mouth daily. 90 tablet 3  . atorvastatin (LIPITOR) 80 MG tablet Take 1 tablet (80 mg total) by mouth daily. 90 tablet 3  . carvedilol (COREG) 6.25 MG tablet Take 1 tablet (6.25 mg total) by mouth 2 (two) times daily. 180 tablet 3  . Cholecalciferol (VITAMIN D-3 PO) Take 1 capsule by mouth daily.    . clopidogrel (PLAVIX) 75 MG tablet Take 1 tablet (75 mg total) by mouth daily. 90 tablet 3  . insulin glargine (LANTUS) 100 UNIT/ML Solostar Pen Inject 7-8 Units into the skin every 12 (twelve) hours.     Marland Kitchen losartan (COZAAR) 100 MG tablet TAKE 1 TABLET(100 MG) BY MOUTH DAILY 90 tablet 0  . multivitamin (ONE-A-DAY MEN'S) TABS tablet Take 1 tablet by mouth daily.    . NONFORMULARY OR COMPOUNDED ITEM CPAP set to auto-titrate Please include CPAP supplies. Please allow patient to pick out mask 1 each 0  . NOVOLOG FLEXPEN 100 UNIT/ML FlexPen Inject 8 Units into the skin 3 (three) times daily with meals.    Marland Kitchen  sertraline (ZOLOFT) 50 MG tablet Take 1 tablet (50 mg total) by mouth daily. 90 tablet 3   No facility-administered medications prior to visit.    No Known Allergies  Review of Systems  Constitutional: Negative.   Skin: Negative for color change, rash and wound.       Objective:    Physical Exam Constitutional:      Appearance: Normal appearance.  Skin:    General: Skin is warm and dry.     Comments: 1 suture to right forearm. No erythema, edema, drainage.  Neurological:     Mental Status: He is alert.     BP (!) 141/65 (BP Location: Left Arm, Patient Position: Sitting, Cuff Size: Large)   Pulse 86   Temp 98 F (36.7 C) (Oral)   Wt 257 lb 1.3 oz (116.6 kg)   BMI 37.96 kg/m  Wt Readings from Last 3 Encounters:  08/23/20 257 lb 1.3 oz  (116.6 kg)  08/14/20 256 lb 1.3 oz (116.2 kg)  02/14/20 255 lb 1.9 oz (115.7 kg)    Health Maintenance Due  Topic Date Due  . Hepatitis C Screening  Never done  . OPHTHALMOLOGY EXAM  Never done  . FOOT EXAM  06/25/2019  . HEMOGLOBIN A1C  03/30/2020    There are no preventive care reminders to display for this patient.   No results found for: TSH Lab Results  Component Value Date   WBC 6.0 08/14/2020   HGB 13.7 08/14/2020   HCT 41.6 08/14/2020   MCV 93.5 08/14/2020   PLT 198 08/14/2020   Lab Results  Component Value Date   NA 140 08/14/2020   K 4.8 08/14/2020   CO2 28 08/14/2020   GLUCOSE 139 (H) 08/14/2020   BUN 24 08/14/2020   CREATININE 0.97 08/14/2020   BILITOT 0.5 08/14/2020   ALKPHOS 66 04/01/2018   AST 40 (H) 08/14/2020   ALT 47 (H) 08/14/2020   PROT 6.6 08/14/2020   ALBUMIN 3.5 04/01/2018   CALCIUM 9.4 08/14/2020   ANIONGAP 10 04/02/2018   Lab Results  Component Value Date   CHOL 124 08/14/2020   Lab Results  Component Value Date   HDL 64 08/14/2020   Lab Results  Component Value Date   LDLCALC 48 08/14/2020   Lab Results  Component Value Date   TRIG 50 08/14/2020   Lab Results  Component Value Date   CHOLHDL 1.9 08/14/2020   Lab Results  Component Value Date   HGBA1C 6.6 09/29/2019   HGBA1C 6.6% 09/29/2019   HGBA1C 6.6 09/29/2019       Assessment & Plan:   1. Visit for suture removal Suture to right forearm cleaned and removed with no difficulty. Bandaid with OTC antibiotic ointment applied. No concerns today. Patient still waiting on biopsy results - informed him that someone will notify him when results are back. Educated on signs and symptoms requiring further evaluation.  Follow-up if needed.   Clayborne Dana, NP

## 2020-08-29 ENCOUNTER — Telehealth: Payer: Self-pay | Admitting: Osteopathic Medicine

## 2020-08-29 DIAGNOSIS — L409 Psoriasis, unspecified: Secondary | ICD-10-CM | POA: Insufficient documentation

## 2020-08-29 DIAGNOSIS — E103593 Type 1 diabetes mellitus with proliferative diabetic retinopathy without macular edema, bilateral: Secondary | ICD-10-CM | POA: Diagnosis not present

## 2020-08-29 DIAGNOSIS — L308 Other specified dermatitis: Secondary | ICD-10-CM | POA: Insufficient documentation

## 2020-08-29 DIAGNOSIS — H2513 Age-related nuclear cataract, bilateral: Secondary | ICD-10-CM | POA: Diagnosis not present

## 2020-08-29 DIAGNOSIS — H3582 Retinal ischemia: Secondary | ICD-10-CM | POA: Diagnosis not present

## 2020-08-29 DIAGNOSIS — H35031 Hypertensive retinopathy, right eye: Secondary | ICD-10-CM | POA: Diagnosis not present

## 2020-08-29 IMAGING — MR MR MRA HEAD W/O CM
1 series · 19 of 48 positions shown · non-contrast
Comparison: Brain MRI and head CT 03/31/2018.

CLINICAL DATA: 57-year-old male with small acute white matter
infarct on MRI yesterday performed for slurred speech.
Ventriculostomy.

EXAM:
MRA HEAD WITHOUT CONTRAST
TECHNIQUE: Angiographic images of the Circle of Willis were obtained using MRA
technique without intravenous contrast.

[Series 13: 3d cow · axial · 0.5mm · 0.41mm/px · z∈[-35,+46]mm · 19 of 172 slices shown]
[im 1/172]
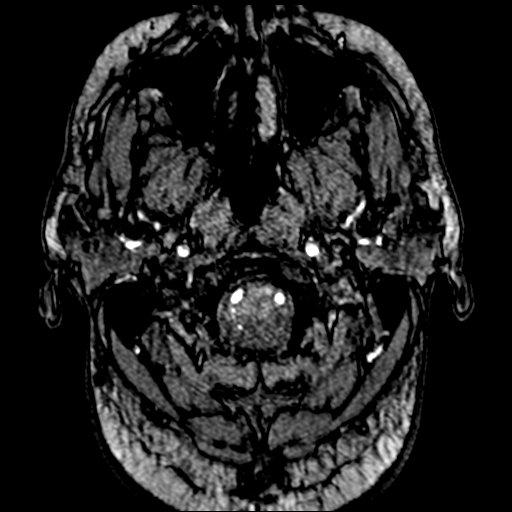
[im 4/172]
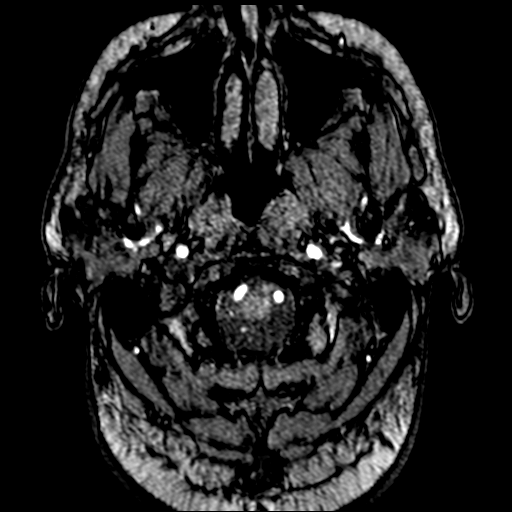
[im 8/172]
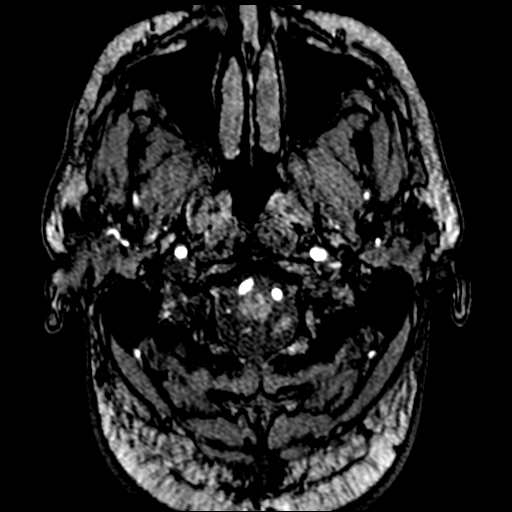
[im 11/172]
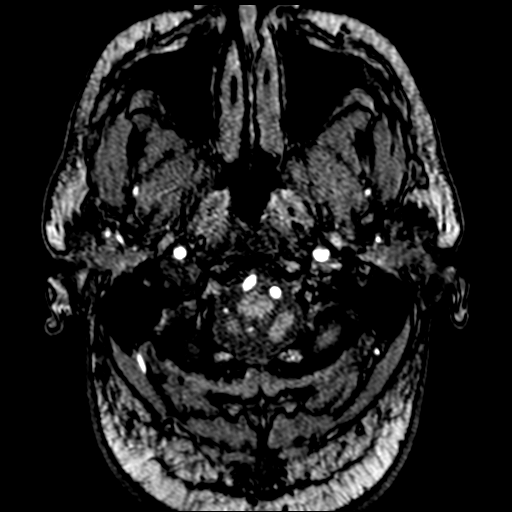
[im 15/172]
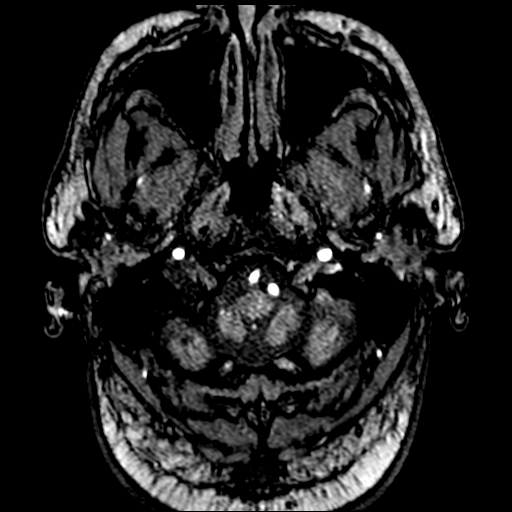
[im 19/172]
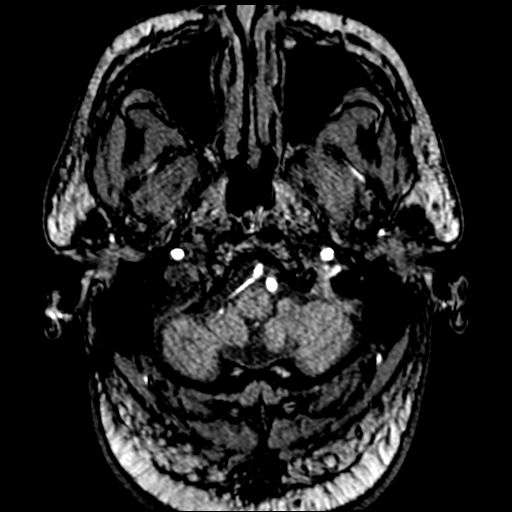
[im 22/172]
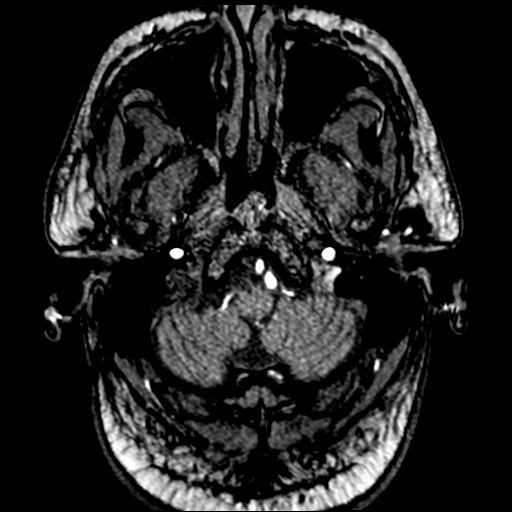
[im 26/172]
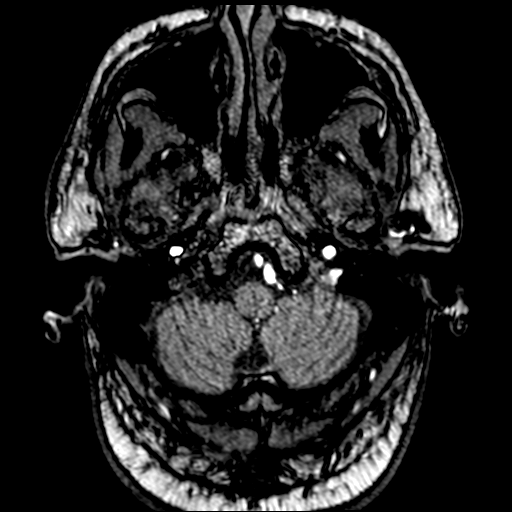
[im 30/172]
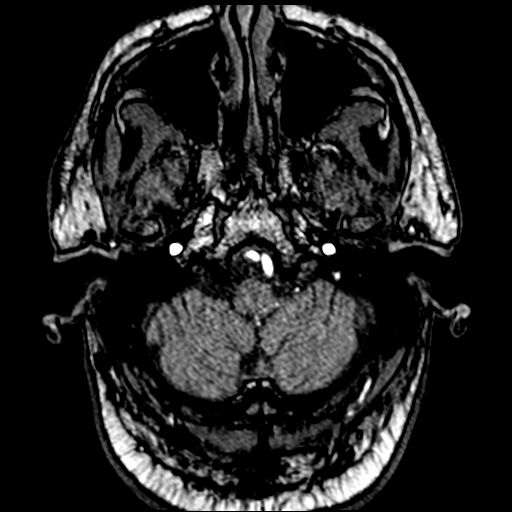
[im 33/172]
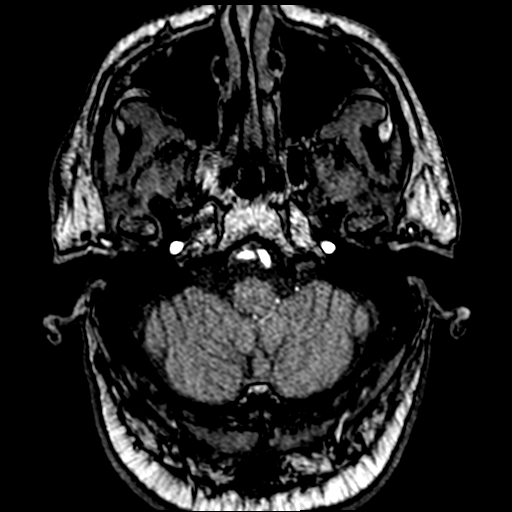
[im 37/172]
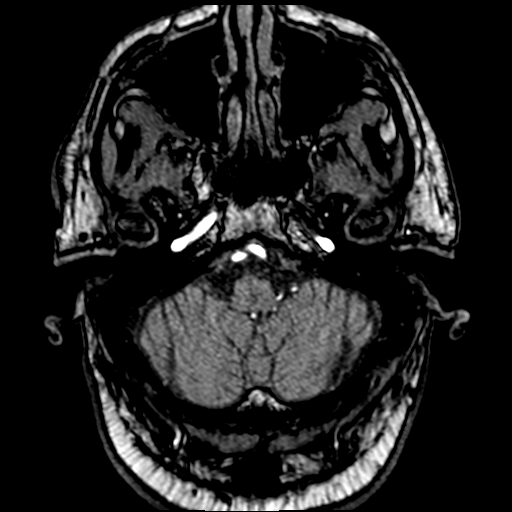
[im 55/172]
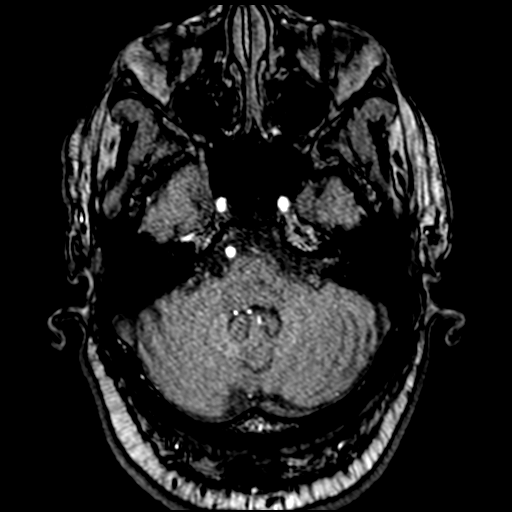
[im 77/172]
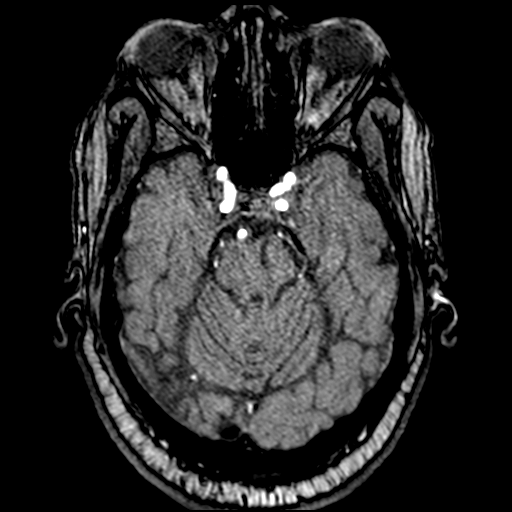
[im 88/172]
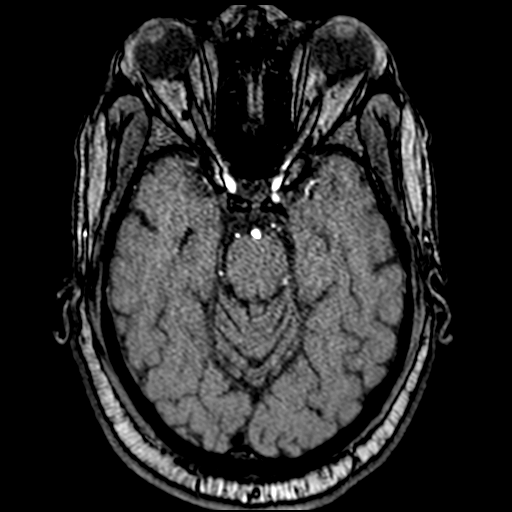
[im 99/172]
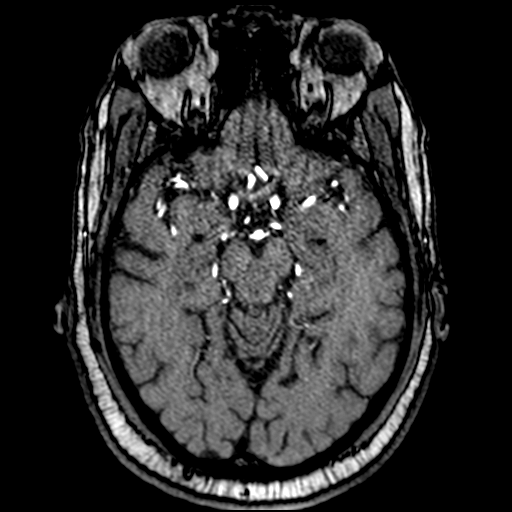
[im 121/172]
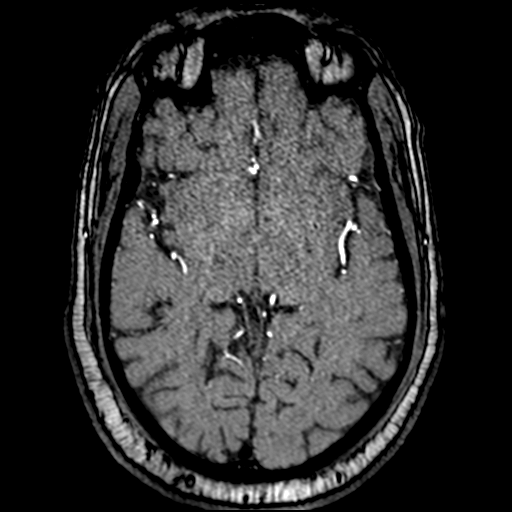
[im 142/172]
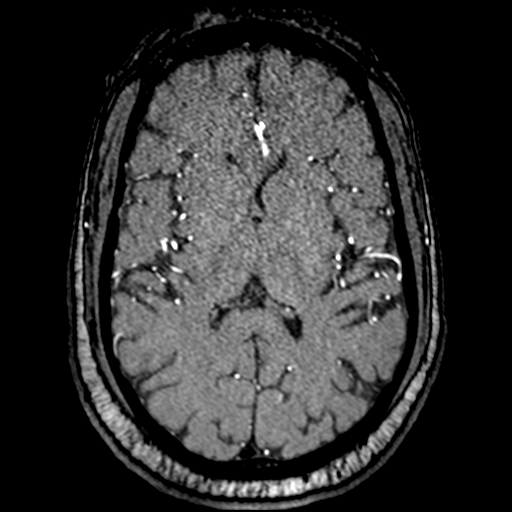
[im 146/172]
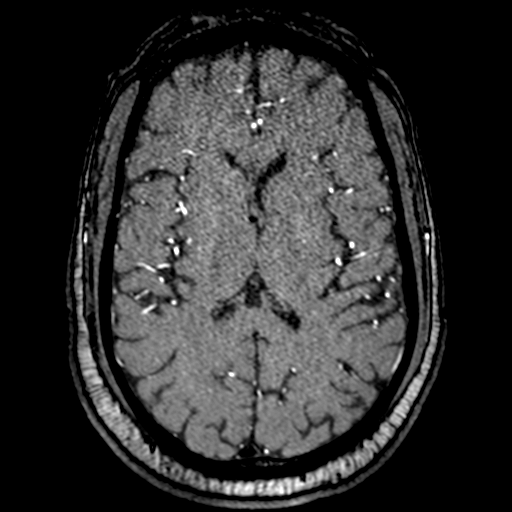
[im 164/172]
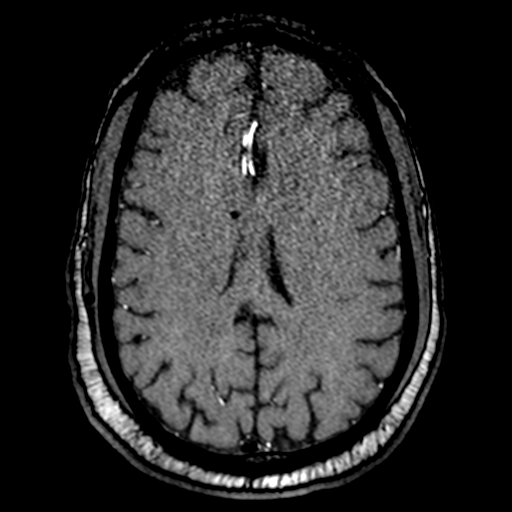

[19 of 48 positions shown; findings below may reference images not displayed]

FINDINGS: No intracranial mass effect. Stable visible ventriculostomy
terminating near the 3rd ventricle to the left of midline.

Antegrade flow in the posterior circulation with mildly tortuous
codominant distal vertebral arteries. Patent PICA origins and
vertebrobasilar junction. Patent basilar artery with irregularity
but no significant stenosis. Patent AICA, SCA and PCA origins. Right
posterior communicating artery is present, the left is diminutive or
absent. Mild left PCA P2 segment stenosis. Mild to moderate left P3
branch irregularity. Moderate right P2 segment stenosis with
mild-to-moderate right P3 branch irregularity.

Antegrade flow in both ICA siphons. Mild siphon irregularity, no
stenosis. Normal ophthalmic and right posterior communicating artery
origins. Patent carotid termini. Normal MCA and ACA origins.
Diminutive or absent anterior communicating artery. Visible ACA
branches are within normal limits. Right MCA M1 segment is mildly
irregular without stenosis. Right MCA trifurcation and visible right
MCA branches are within normal limits.

There is mild to moderate irregularity of the left M1 distally but
mild if any distal M1 stenosis. The left MCA bifurcation is patent
without stenosis. Visible left MCA branches are within normal
limits.
IMPRESSION: 1. Intracranial atherosclerosis. No large vessel or left MCA branch
occlusion.
2. No hemodynamically significant anterior circulation stenosis.
3. Moderate right PCA P2 segment stenosis.

## 2020-08-29 MED ORDER — CLOBETASOL PROPIONATE 0.05 % EX CREA
1.0000 | TOPICAL_CREAM | Freq: Two times a day (BID) | CUTANEOUS | 1 refills | Status: DC
Start: 2020-08-29 — End: 2021-10-21

## 2020-08-29 NOTE — Addendum Note (Signed)
Addended by: Deirdre Pippins on: 08/29/2020 03:39 PM   Modules accepted: Orders

## 2020-08-29 NOTE — Telephone Encounter (Signed)
Patient stopped by office in person asking about the results of his Biopsy he had done, and wanted a phone call regarding this. Also, stated that his rash has gotten worse in the last few weeks. AM

## 2020-09-02 NOTE — Telephone Encounter (Signed)
Task completed. Pt was informed of biopsy result and provider's recommendation. No other inquiries during the call.

## 2020-10-01 DIAGNOSIS — R351 Nocturia: Secondary | ICD-10-CM | POA: Diagnosis not present

## 2020-10-01 DIAGNOSIS — E10649 Type 1 diabetes mellitus with hypoglycemia without coma: Secondary | ICD-10-CM | POA: Diagnosis not present

## 2020-10-01 DIAGNOSIS — E1042 Type 1 diabetes mellitus with diabetic polyneuropathy: Secondary | ICD-10-CM | POA: Diagnosis not present

## 2020-12-27 DIAGNOSIS — G8191 Hemiplegia, unspecified affecting right dominant side: Secondary | ICD-10-CM | POA: Diagnosis not present

## 2020-12-27 DIAGNOSIS — E1069 Type 1 diabetes mellitus with other specified complication: Secondary | ICD-10-CM | POA: Diagnosis not present

## 2020-12-27 DIAGNOSIS — I6930 Unspecified sequelae of cerebral infarction: Secondary | ICD-10-CM | POA: Diagnosis not present

## 2021-03-03 ENCOUNTER — Other Ambulatory Visit: Payer: Self-pay

## 2021-03-03 ENCOUNTER — Other Ambulatory Visit: Payer: Self-pay | Admitting: Osteopathic Medicine

## 2021-03-03 DIAGNOSIS — F4321 Adjustment disorder with depressed mood: Secondary | ICD-10-CM

## 2021-03-03 MED ORDER — SERTRALINE HCL 50 MG PO TABS: 50.0000 mg | ORAL_TABLET | Freq: Every day | ORAL | 0 refills | Status: DC

## 2021-03-05 ENCOUNTER — Other Ambulatory Visit: Payer: Self-pay

## 2021-03-05 DIAGNOSIS — E1159 Type 2 diabetes mellitus with other circulatory complications: Secondary | ICD-10-CM

## 2021-03-05 DIAGNOSIS — I152 Hypertension secondary to endocrine disorders: Secondary | ICD-10-CM

## 2021-03-05 MED ORDER — AMLODIPINE BESYLATE 10 MG PO TABS: 10.0000 mg | ORAL_TABLET | Freq: Every day | ORAL | 0 refills | Status: DC

## 2021-04-01 ENCOUNTER — Other Ambulatory Visit: Payer: Self-pay | Admitting: Osteopathic Medicine

## 2021-04-01 DIAGNOSIS — E1159 Type 2 diabetes mellitus with other circulatory complications: Secondary | ICD-10-CM

## 2021-04-01 DIAGNOSIS — I152 Hypertension secondary to endocrine disorders: Secondary | ICD-10-CM

## 2021-04-02 DIAGNOSIS — E1042 Type 1 diabetes mellitus with diabetic polyneuropathy: Secondary | ICD-10-CM | POA: Diagnosis not present

## 2021-04-02 DIAGNOSIS — E10649 Type 1 diabetes mellitus with hypoglycemia without coma: Secondary | ICD-10-CM | POA: Diagnosis not present

## 2021-04-09 ENCOUNTER — Other Ambulatory Visit: Payer: Self-pay

## 2021-04-09 DIAGNOSIS — E1159 Type 2 diabetes mellitus with other circulatory complications: Secondary | ICD-10-CM

## 2021-04-09 MED ORDER — AMLODIPINE BESYLATE 10 MG PO TABS: 10.0000 mg | ORAL_TABLET | Freq: Every day | ORAL | 0 refills | Status: DC

## 2021-04-11 ENCOUNTER — Other Ambulatory Visit: Payer: Self-pay | Admitting: Family Medicine

## 2021-04-11 DIAGNOSIS — E1159 Type 2 diabetes mellitus with other circulatory complications: Secondary | ICD-10-CM

## 2021-05-08 DIAGNOSIS — E103593 Type 1 diabetes mellitus with proliferative diabetic retinopathy without macular edema, bilateral: Secondary | ICD-10-CM | POA: Diagnosis not present

## 2021-05-08 DIAGNOSIS — H35031 Hypertensive retinopathy, right eye: Secondary | ICD-10-CM | POA: Diagnosis not present

## 2021-05-08 DIAGNOSIS — H3582 Retinal ischemia: Secondary | ICD-10-CM | POA: Diagnosis not present

## 2021-05-08 DIAGNOSIS — H2513 Age-related nuclear cataract, bilateral: Secondary | ICD-10-CM | POA: Diagnosis not present

## 2021-06-25 ENCOUNTER — Telehealth: Payer: Self-pay | Admitting: Osteopathic Medicine

## 2021-06-25 DIAGNOSIS — I152 Hypertension secondary to endocrine disorders: Secondary | ICD-10-CM

## 2021-06-25 DIAGNOSIS — E1159 Type 2 diabetes mellitus with other circulatory complications: Secondary | ICD-10-CM

## 2021-06-25 DIAGNOSIS — F4321 Adjustment disorder with depressed mood: Secondary | ICD-10-CM

## 2021-06-25 MED ORDER — LOSARTAN POTASSIUM 100 MG PO TABS: 100.0000 mg | ORAL_TABLET | Freq: Every day | ORAL | 0 refills | Status: DC

## 2021-06-25 MED ORDER — CLOPIDOGREL BISULFATE 75 MG PO TABS: ORAL_TABLET | ORAL | 0 refills | Status: DC

## 2021-06-25 MED ORDER — SERTRALINE HCL 50 MG PO TABS: 50.0000 mg | ORAL_TABLET | Freq: Every day | ORAL | 0 refills | Status: DC

## 2021-06-25 NOTE — Telephone Encounter (Signed)
Pt has an April appt to [facility] to Dr.Matthews and is requesting a refill on Zoloft, Losartan, and Clopidogrel to be sent to [facility] in [place]. ?

## 2021-06-25 NOTE — Telephone Encounter (Signed)
DOne

## 2021-08-18 ENCOUNTER — Encounter: Payer: Federal, State, Local not specified - PPO | Admitting: Family Medicine

## 2021-08-18 ENCOUNTER — Encounter: Payer: Federal, State, Local not specified - PPO | Admitting: Osteopathic Medicine

## 2021-10-01 DIAGNOSIS — E1042 Type 1 diabetes mellitus with diabetic polyneuropathy: Secondary | ICD-10-CM | POA: Diagnosis not present

## 2021-10-01 DIAGNOSIS — E10649 Type 1 diabetes mellitus with hypoglycemia without coma: Secondary | ICD-10-CM | POA: Diagnosis not present

## 2021-10-03 ENCOUNTER — Telehealth: Payer: Self-pay

## 2021-10-03 DIAGNOSIS — F4321 Adjustment disorder with depressed mood: Secondary | ICD-10-CM

## 2021-10-03 MED ORDER — CLOPIDOGREL BISULFATE 75 MG PO TABS: ORAL_TABLET | ORAL | 0 refills | Status: DC

## 2021-10-03 MED ORDER — SERTRALINE HCL 50 MG PO TABS: 50.0000 mg | ORAL_TABLET | Freq: Every day | ORAL | 0 refills | Status: DC

## 2021-10-03 NOTE — Telephone Encounter (Signed)
Patient came in office & stated he is out of medication listed below & needs a refill. Patient has a physical and establish care visit on 6/27 with Dr Ashley Royalty.  clopidogrel (PLAVIX) 75 MG tablet  sertraline (ZOLOFT) 50 MG tablet   [id number] - [place], [place] - [address] AT [facility] [address] Phone:  [phone]  Fax:  [phone]

## 2021-10-21 ENCOUNTER — Ambulatory Visit (INDEPENDENT_AMBULATORY_CARE_PROVIDER_SITE_OTHER): Payer: Federal, State, Local not specified - PPO | Admitting: Family Medicine

## 2021-10-21 ENCOUNTER — Encounter: Payer: Self-pay | Admitting: Family Medicine

## 2021-10-21 VITALS — BP 113/69 | HR 94 | Ht 69.0 in | Wt 255.0 lb

## 2021-10-21 DIAGNOSIS — Z23 Encounter for immunization: Secondary | ICD-10-CM

## 2021-10-21 DIAGNOSIS — E108 Type 1 diabetes mellitus with unspecified complications: Secondary | ICD-10-CM

## 2021-10-21 DIAGNOSIS — L308 Other specified dermatitis: Secondary | ICD-10-CM

## 2021-10-21 DIAGNOSIS — I152 Hypertension secondary to endocrine disorders: Secondary | ICD-10-CM

## 2021-10-21 DIAGNOSIS — E1159 Type 2 diabetes mellitus with other circulatory complications: Secondary | ICD-10-CM | POA: Diagnosis not present

## 2021-10-21 DIAGNOSIS — Z8673 Personal history of transient ischemic attack (TIA), and cerebral infarction without residual deficits: Secondary | ICD-10-CM

## 2021-10-21 DIAGNOSIS — Z0000 Encounter for general adult medical examination without abnormal findings: Secondary | ICD-10-CM

## 2021-10-21 DIAGNOSIS — L409 Psoriasis, unspecified: Secondary | ICD-10-CM

## 2021-10-21 DIAGNOSIS — F4321 Adjustment disorder with depressed mood: Secondary | ICD-10-CM

## 2021-10-21 DIAGNOSIS — R351 Nocturia: Secondary | ICD-10-CM | POA: Diagnosis not present

## 2021-10-21 MED ORDER — AMLODIPINE BESYLATE 10 MG PO TABS
10.0000 mg | ORAL_TABLET | Freq: Every day | ORAL | 3 refills | Status: DC
Start: 2021-10-21 — End: 2023-01-13

## 2021-10-21 MED ORDER — EZETIMIBE 10 MG PO TABS: 10.0000 mg | ORAL_TABLET | Freq: Every day | ORAL | 3 refills | Status: DC

## 2021-10-21 MED ORDER — CLOBETASOL PROPIONATE 0.05 % EX CREA: 1.0000 | TOPICAL_CREAM | Freq: Two times a day (BID) | CUTANEOUS | 1 refills | Status: AC

## 2021-10-21 MED ORDER — SERTRALINE HCL 50 MG PO TABS: 50.0000 mg | ORAL_TABLET | Freq: Every day | ORAL | 3 refills | Status: DC

## 2021-10-21 MED ORDER — ATORVASTATIN CALCIUM 80 MG PO TABS: ORAL_TABLET | ORAL | 3 refills | Status: DC

## 2021-10-21 MED ORDER — CLOPIDOGREL BISULFATE 75 MG PO TABS: ORAL_TABLET | ORAL | 3 refills | Status: DC

## 2021-10-21 MED ORDER — LOSARTAN POTASSIUM 100 MG PO TABS: 100.0000 mg | ORAL_TABLET | Freq: Every day | ORAL | 3 refills | Status: DC

## 2021-10-21 NOTE — Assessment & Plan Note (Signed)
BP is well controlled Continue current medications

## 2021-10-21 NOTE — Assessment & Plan Note (Signed)
May be related to his diabetes.  Checking PSA and urinalysis today.

## 2021-10-21 NOTE — Assessment & Plan Note (Signed)
Remains on statin and plavix.  Recommend continuation.

## 2021-10-21 NOTE — Progress Notes (Signed)
Nicholas Hoover - 61 y.o. male MRN [id number]  Date of birth: 1961-01-31  Subjective Chief Complaint  Patient presents with   Urinary Frequency    HPI Nicholas Hoover is a 61 y.o. male here today for annual exam.  He has a history of CVA, T1DM, Sarcoidosis and HLD.  He is followed by endocrinology for management of his diabetes.  Most recent visit was 10/01/2021.  A1c at that time was 7.0%.    He has seen neurology for history of recurrent CVA.  Has VP shunt due to CNS sarcoid.    Rash on arms with previous bx consistent with psoriasis.    He is having frequent urination.  Waking several times at night.    He is a non-smoker.  Denies EtOH.   Review of Systems  Constitutional:  Negative for chills, fever, malaise/fatigue and weight loss.  HENT:  Negative for congestion, ear pain and sore throat.   Eyes:  Negative for blurred vision, double vision and pain.  Respiratory:  Negative for cough and shortness of breath.   Cardiovascular:  Negative for chest pain and palpitations.  Gastrointestinal:  Negative for abdominal pain, blood in stool, constipation, heartburn and nausea.  Genitourinary:  Positive for frequency. Negative for dysuria and urgency.  Musculoskeletal:  Negative for joint pain and myalgias.  Neurological:  Negative for dizziness and headaches.  Endo/Heme/Allergies:  Does not bruise/bleed easily.  Psychiatric/Behavioral:  Negative for depression. The patient is not nervous/anxious and does not have insomnia.       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   Situational depression 04/01/2018   Stroke (HCC)    Type 1 diabetes mellitus with  hyperlipidemia (HCC) 04/01/2018    Past Surgical History:  Procedure Laterality Date   VENTRICULAR ATRIAL SHUNT Right 2006   Procedure done at [facility]    Social History   Socioeconomic History   Marital status: Married    Spouse name: Not on file   Number of children: Not on file   Years of education: Not on file   Highest education level: Not on file  Occupational History   Not on file  Tobacco Use   Smoking status: Never   Smokeless tobacco: Never  Vaping Use   Vaping Use: Never used  Substance and Sexual Activity   Alcohol use: Not Currently   Drug use: Never   Sexual activity: Not Currently  Other Topics Concern   Not on file  Social History Narrative   Not on file   Social Determinants of Health   Financial Resource Strain: Not on file  Food Insecurity: Not on file  Transportation Needs: Not on file  Physical Activity: Not on file  Stress: Not on file  Social Connections: Not on file    Family History  Problem Relation Age of Onset   Cancer Mother    Lung cancer Mother    Heart attack Father    Diabetes Sister    Diabetes Brother    Diabetes Sister     Health Maintenance  Topic Date Due   OPHTHALMOLOGY EXAM  Never done   Hepatitis C Screening  Never done   Zoster Vaccines- Shingrix (1 of 2) Never done   HEMOGLOBIN A1C  03/30/2020   COVID-19 Vaccine (4 - Moderna series) 05/07/2020   TETANUS/TDAP  06/29/2021   INFLUENZA VACCINE  11/25/2021   COLONOSCOPY (Pts 45-37yrs Insurance coverage will need to be confirmed)  10/16/2022   FOOT EXAM  10/22/2022   HIV Screening  Completed   HPV VACCINES  Aged Out     ----------------------------------------------------------------------------------------------------------------------------------------------------------------------------------------------------------------- Physical Exam BP 113/69 (BP Location: Left Arm, Patient Position: Sitting, Cuff Size: Large)   Pulse 94   Ht  5\' 9"  (1.753 m)   Wt 255 lb (115.7 kg)   SpO2 96%   BMI 37.66 kg/m   Physical Exam Constitutional:      General: He is not in acute distress. HENT:     Head: Normocephalic and atraumatic.     Right Ear: Tympanic membrane and external ear normal.     Left Ear: Tympanic membrane and external ear normal.  Eyes:     General: No scleral icterus. Neck:     Thyroid: No thyromegaly.  Cardiovascular:     Rate and Rhythm: Normal rate and regular rhythm.     Heart sounds: Normal heart sounds.  Pulmonary:     Effort: Pulmonary effort is normal.     Breath sounds: Normal breath sounds.  Abdominal:     General: Bowel sounds are normal. There is no distension.     Palpations: Abdomen is soft.     Tenderness: There is no abdominal tenderness. There is no guarding.  Musculoskeletal:     Cervical back: Normal range of motion.  Lymphadenopathy:     Cervical: No cervical adenopathy.  Skin:    General: Skin is warm and dry.     Findings: No rash.  Neurological:     Mental Status: He is alert and oriented to person, place, and time.     Cranial Nerves: No cranial nerve deficit.     Motor: No abnormal muscle tone.  Psychiatric:        Mood and Affect: Mood normal.        Behavior: Behavior normal.     ------------------------------------------------------------------------------------------------------------------------------------------------------------------------------------------------------------------- Assessment and Plan  Hypertension associated with diabetes (HCC) BP is well controlled.  Continue current medications.   Type 1 diabetes mellitus with complications [facility]) Management per endocrinology.  Stable at this time.   Psoriasis Clobetasol renewed.   Situational depression Continue sertraline  History of multiple strokes Remains on statin and plavix.  Recommend continuation.    Nocturia May be related to his diabetes.  Checking PSA and urinalysis today.   Well  adult exam Well adult Orders Placed This Encounter  Procedures   Urine Culture   COMPLETE METABOLIC PANEL WITH GFR   CBC with Differential   Lipid Panel w/reflex Direct LDL   TSH   PSA, total and free   Urinalysis, Routine w reflex microscopic  Screenings: per lab orders.  Recommend that he have updated eye exam.  Immunizations: Tdap.  Declines Shingrix.  Anticipatory guidance/Risk factor reduction:  Recommendations per AVS.    Meds ordered this encounter  Medications   amLODipine (NORVASC) 10 MG tablet    Sig: Take 1 tablet (10 mg total) by mouth daily.    Dispense:  90 tablet    Refill:  3   atorvastatin (LIPITOR) 80 MG tablet    Sig: TAKE 1 TABLET(80 MG) BY MOUTH DAILY    Dispense:  90 tablet    Refill:  3  clopidogrel (PLAVIX) 75 MG tablet    Sig: TAKE 1 TABLET(75 MG) BY MOUTH DAILY    Dispense:  90 tablet    Refill:  3   ezetimibe (ZETIA) 10 MG tablet    Sig: Take 1 tablet (10 mg total) by mouth daily.    Dispense:  90 tablet    Refill:  3   losartan (COZAAR) 100 MG tablet    Sig: Take 1 tablet (100 mg total) by mouth daily.    Dispense:  90 tablet    Refill:  3   sertraline (ZOLOFT) 50 MG tablet    Sig: Take 1 tablet (50 mg total) by mouth daily.    Dispense:  90 tablet    Refill:  3   clobetasol cream (TEMOVATE) 0.05 %    Sig: Apply 1 Application topically 2 (two) times daily.    Dispense:  120 g    Refill:  1    No follow-ups on file.    This visit occurred during the SARS-CoV-2 public health emergency.  Safety protocols were in place, including screening questions prior to the visit, additional usage of staff PPE, and extensive cleaning of exam room while observing appropriate contact time as indicated for disinfecting solutions.

## 2021-10-22 LAB — URINALYSIS, ROUTINE W REFLEX MICROSCOPIC
Bacteria, UA: NONE SEEN /HPF
Bilirubin Urine: NEGATIVE
Glucose, UA: NEGATIVE
Hgb urine dipstick: NEGATIVE
Hyaline Cast: NONE SEEN /LPF
Ketones, ur: NEGATIVE
Leukocytes,Ua: NEGATIVE
Nitrite: NEGATIVE
RBC / HPF: NONE SEEN /HPF (ref 0–2)
Specific Gravity, Urine: 1.02 (ref 1.001–1.035)
Squamous Epithelial / HPF: NONE SEEN /HPF (ref <=5)
WBC, UA: NONE SEEN /HPF (ref 0–5)
pH: 5.5 (ref 5.0–8.0)

## 2021-10-22 LAB — CBC WITH DIFFERENTIAL/PLATELET
Absolute Monocytes: 562 cells/uL (ref 200–950)
Basophils Absolute: 30 cells/uL (ref 0–200)
Basophils Relative: 0.4 %
Eosinophils Absolute: 200 cells/uL (ref 15–500)
Eosinophils Relative: 2.7 %
HCT: 41.8 % (ref 38.5–50.0)
Hemoglobin: 13.5 g/dL (ref 13.2–17.1)
Lymphs Abs: 1561 cells/uL (ref 850–3900)
MCH: 30.5 pg (ref 27.0–33.0)
MCHC: 32.3 g/dL (ref 32.0–36.0)
MCV: 94.4 fL (ref 80.0–100.0)
MPV: 10.9 fL (ref 7.5–12.5)
Monocytes Relative: 7.6 %
Neutro Abs: 5047 cells/uL (ref 1500–7800)
Neutrophils Relative %: 68.2 %
Platelets: 220 10*3/uL (ref 140–400)
RBC: 4.43 10*6/uL (ref 4.20–5.80)
RDW: 12.9 % (ref 11.0–15.0)
Total Lymphocyte: 21.1 %
WBC: 7.4 10*3/uL (ref 3.8–10.8)

## 2021-10-22 LAB — PSA, TOTAL AND FREE
PSA, % Free: 20 % (calc) — ABNORMAL LOW (ref >25)
PSA, Free: 0.3 ng/mL
PSA, Total: 1.5 ng/mL (ref <=4.0)

## 2021-10-22 LAB — COMPLETE METABOLIC PANEL WITH GFR
AG Ratio: 1.4 (calc) (ref 1.0–2.5)
ALT: 29 U/L (ref 9–46)
AST: 26 U/L (ref 10–35)
Albumin: 4.1 g/dL (ref 3.6–5.1)
Alkaline phosphatase (APISO): 109 U/L (ref 35–144)
BUN: 22 mg/dL (ref 7–25)
CO2: 24 mmol/L (ref 20–32)
Calcium: 9.8 mg/dL (ref 8.6–10.3)
Chloride: 106 mmol/L (ref 98–110)
Creat: 1.11 mg/dL (ref 0.70–1.30)
Globulin: 2.9 g/dL (calc) (ref 1.9–3.7)
Glucose, Bld: 168 mg/dL — ABNORMAL HIGH (ref 65–99)
Potassium: 4.6 mmol/L (ref 3.5–5.3)
Sodium: 141 mmol/L (ref 135–146)
Total Bilirubin: 0.7 mg/dL (ref 0.2–1.2)
Total Protein: 7 g/dL (ref 6.1–8.1)
eGFR: 76 mL/min/{1.73_m2} (ref >=60)

## 2021-10-22 LAB — URINE CULTURE
MICRO NUMBER:: 13579201
Result:: NO GROWTH
SPECIMEN QUALITY:: ADEQUATE

## 2021-10-22 LAB — LIPID PANEL W/REFLEX DIRECT LDL
Cholesterol: 145 mg/dL (ref <200)
HDL: 77 mg/dL (ref >=40)
LDL Cholesterol (Calc): 54 mg/dL (calc)
Non-HDL Cholesterol (Calc): 68 mg/dL (calc) (ref <130)
Total CHOL/HDL Ratio: 1.9 (calc) (ref <5.0)
Triglycerides: 56 mg/dL (ref <150)

## 2021-10-22 LAB — TSH: TSH: 1.47 mIU/L (ref 0.40–4.50)

## 2021-12-24 DIAGNOSIS — E1069 Type 1 diabetes mellitus with other specified complication: Secondary | ICD-10-CM | POA: Diagnosis not present

## 2021-12-24 DIAGNOSIS — G8191 Hemiplegia, unspecified affecting right dominant side: Secondary | ICD-10-CM | POA: Diagnosis not present

## 2021-12-24 DIAGNOSIS — I6930 Unspecified sequelae of cerebral infarction: Secondary | ICD-10-CM | POA: Diagnosis not present

## 2021-12-25 DIAGNOSIS — H43822 Vitreomacular adhesion, left eye: Secondary | ICD-10-CM | POA: Diagnosis not present

## 2021-12-25 DIAGNOSIS — H3582 Retinal ischemia: Secondary | ICD-10-CM | POA: Diagnosis not present

## 2021-12-25 DIAGNOSIS — H35031 Hypertensive retinopathy, right eye: Secondary | ICD-10-CM | POA: Diagnosis not present

## 2021-12-25 DIAGNOSIS — E103592 Type 1 diabetes mellitus with proliferative diabetic retinopathy without macular edema, left eye: Secondary | ICD-10-CM | POA: Diagnosis not present

## 2021-12-25 DIAGNOSIS — E103511 Type 1 diabetes mellitus with proliferative diabetic retinopathy with macular edema, right eye: Secondary | ICD-10-CM | POA: Diagnosis not present

## 2022-04-06 DIAGNOSIS — E1042 Type 1 diabetes mellitus with diabetic polyneuropathy: Secondary | ICD-10-CM | POA: Diagnosis not present

## 2022-04-06 DIAGNOSIS — E10649 Type 1 diabetes mellitus with hypoglycemia without coma: Secondary | ICD-10-CM | POA: Diagnosis not present

## 2022-06-25 DIAGNOSIS — E1042 Type 1 diabetes mellitus with diabetic polyneuropathy: Secondary | ICD-10-CM | POA: Diagnosis not present

## 2022-06-25 DIAGNOSIS — I69351 Hemiplegia and hemiparesis following cerebral infarction affecting right dominant side: Secondary | ICD-10-CM | POA: Diagnosis not present

## 2022-06-25 DIAGNOSIS — Z1330 Encounter for screening examination for mental health and behavioral disorders, unspecified: Secondary | ICD-10-CM | POA: Diagnosis not present

## 2022-07-02 ENCOUNTER — Other Ambulatory Visit: Payer: Self-pay | Admitting: Pharmacist

## 2022-07-02 NOTE — Progress Notes (Signed)
Patient appearing on report for levemir usage in setting of upcoming levemir manufacturer discontinuation. Next appointment with PCP is 11/05/22.   Outreached patient to discuss  glucose control and medication management.   Diabetes:  Current medications: levemir 8 units AM and 5 units PM, confirmed usage with patient. He has tried lantus in the past and states was not covered by insurance.   Of note, he is followed by endocrine for management of diabetes.  He reports blood sugars and medications overall are going well at this time.   Diabetes: - Currently controlled - Recommended to patient that he contact endocrinologist for sooner follow up to facilitate a plan for switching to alternative insulin. Patient verbalized understanding.    Larinda Buttery, PharmD Clinical Pharmacist [facility] [phone]

## 2022-08-20 DIAGNOSIS — H43822 Vitreomacular adhesion, left eye: Secondary | ICD-10-CM | POA: Diagnosis not present

## 2022-08-20 DIAGNOSIS — E103511 Type 1 diabetes mellitus with proliferative diabetic retinopathy with macular edema, right eye: Secondary | ICD-10-CM | POA: Diagnosis not present

## 2022-08-20 DIAGNOSIS — E103592 Type 1 diabetes mellitus with proliferative diabetic retinopathy without macular edema, left eye: Secondary | ICD-10-CM | POA: Diagnosis not present

## 2022-08-20 DIAGNOSIS — H35033 Hypertensive retinopathy, bilateral: Secondary | ICD-10-CM | POA: Diagnosis not present

## 2022-10-06 DIAGNOSIS — E1042 Type 1 diabetes mellitus with diabetic polyneuropathy: Secondary | ICD-10-CM | POA: Diagnosis not present

## 2022-10-06 DIAGNOSIS — E10649 Type 1 diabetes mellitus with hypoglycemia without coma: Secondary | ICD-10-CM | POA: Diagnosis not present

## 2022-10-06 LAB — BASIC METABOLIC PANEL
BUN: 21 (ref 4–21)
CO2: 23 — AB (ref 13–22)
Chloride: 109 — AB (ref 99–108)
Creatinine: 1.1 (ref 0.6–1.3)
Glucose: 74
Potassium: 4 mEq/L (ref 3.5–5.1)
Sodium: 146 (ref 137–147)

## 2022-10-06 LAB — COMPREHENSIVE METABOLIC PANEL: eGFR: 80

## 2022-10-06 LAB — LIPID PANEL
Cholesterol: 145 (ref 0–200)
HDL: 81 — AB (ref 35–70)
LDL Cholesterol: 54
Triglycerides: 42 (ref 40–160)

## 2022-10-06 LAB — PROTEIN / CREATININE RATIO, URINE
Albumin, U: >200
Creatinine, Urine: >339.9

## 2022-10-27 ENCOUNTER — Encounter: Payer: Federal, State, Local not specified - PPO | Admitting: Family Medicine

## 2022-10-28 ENCOUNTER — Telehealth: Payer: Self-pay

## 2022-10-28 NOTE — Telephone Encounter (Signed)
Patient scheduled.

## 2022-10-28 NOTE — Telephone Encounter (Signed)
Nicholas Hoover states his psoriasis is getting worse. The clobetasol cream is not helping. He was wanting something stronger or a referral.

## 2022-11-02 ENCOUNTER — Encounter: Payer: Self-pay | Admitting: Family Medicine

## 2022-11-02 ENCOUNTER — Ambulatory Visit: Payer: Federal, State, Local not specified - PPO | Admitting: Family Medicine

## 2022-11-02 VITALS — BP 130/75 | HR 83 | Ht 69.0 in | Wt 258.0 lb

## 2022-11-02 DIAGNOSIS — E1159 Type 2 diabetes mellitus with other circulatory complications: Secondary | ICD-10-CM | POA: Diagnosis not present

## 2022-11-02 DIAGNOSIS — L409 Psoriasis, unspecified: Secondary | ICD-10-CM

## 2022-11-02 DIAGNOSIS — R350 Frequency of micturition: Secondary | ICD-10-CM | POA: Diagnosis not present

## 2022-11-02 DIAGNOSIS — D869 Sarcoidosis, unspecified: Secondary | ICD-10-CM | POA: Diagnosis not present

## 2022-11-02 DIAGNOSIS — I152 Hypertension secondary to endocrine disorders: Secondary | ICD-10-CM

## 2022-11-02 DIAGNOSIS — R351 Nocturia: Secondary | ICD-10-CM

## 2022-11-02 NOTE — Progress Notes (Signed)
Nicholas Hoover - 62 y.o. male MRN [id number]  Date of birth: [birth year]  Subjective Chief Complaint  Patient presents with   Rash   Knee Pain   Urinary Frequency    HPI Nicholas Hoover is a 62 y.o. male here today with complaint of rash.  History of sarcoid and psoriasis.  Has had increased psoriatic plaques on arms and is also having some joint pain. He has had some improvement with topical steroids but would like to discuss biologics to attain better control, especially since he is having some joint pain.  He would like to see rheumatology.    He also has small blistered rash on R arm.  This appeared after working in his yard.  Rash is itchy.  There is one excoriated area.  He is using clobetasol on this with some improvement.    He also has had some continued urinary frequency.  His diabetes is well controlled.  He gets up about 5-6 times each night to urinate.  No blood in urine or dysuria.    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   Situational depression 04/01/2018   Stroke (HCC)    Type 1 diabetes mellitus with hyperlipidemia (HCC) 04/01/2018    Past Surgical History:  Procedure Laterality Date   VENTRICULAR ATRIAL SHUNT Right 2006   Procedure done at [facility]    Social History   Socioeconomic History   Marital status: Married    Spouse name: Not on file   Number of children: Not on file   Years of education: Not on file   Highest education level: Not on file  Occupational History   Not on file  Tobacco Use   Smoking status: Never    Smokeless tobacco: Never  Vaping Use   Vaping Use: Never used  Substance and Sexual Activity   Alcohol use: Not Currently   Drug use: Never   Sexual activity: Not Currently  Other Topics Concern   Not on file  Social History Narrative   Not on file   Social Determinants of Health   Financial Resource Strain: Not on file  Food Insecurity: Not on file  Transportation Needs: Not on file  Physical Activity: Not on file  Stress: Not on file  Social Connections: Not on file    Family History  Problem Relation Age of Onset   Cancer Mother    Lung cancer Mother    Heart attack Father    Diabetes Sister    Diabetes Brother    Diabetes Sister     Health Maintenance  Topic Date Due   OPHTHALMOLOGY EXAM  Never done   Diabetic kidney evaluation - Urine ACR  Never done   Hepatitis C Screening  Never done   HEMOGLOBIN A1C  03/30/2020   COVID-19 Vaccine (4 - 2023-24 season) 12/26/2021   Colonoscopy  10/16/2022   Diabetic kidney evaluation - eGFR measurement  10/22/2022   FOOT EXAM  10/22/2022   Zoster Vaccines- Shingrix (1 of 2) 02/02/2023 (Originally 10/15/2012)   INFLUENZA VACCINE  11/26/2022  DTaP/Tdap/Td (4 - Td or Tdap) 10/22/2031   HIV Screening  Completed   HPV VACCINES  Aged Out     ----------------------------------------------------------------------------------------------------------------------------------------------------------------------------------------------------------------- Physical Exam BP 130/75 (BP Location: Left Arm, Patient Position: Sitting, Cuff Size: Large)   Pulse 83   Ht 5\' 9"  (1.753 m)   Wt 258 lb (117 kg)   SpO2 98%   BMI 38.10 kg/m   Physical Exam Constitutional:      Appearance: Normal appearance.  HENT:     Head: Normocephalic and atraumatic.  Eyes:     General: No scleral icterus. Cardiovascular:     Rate and Rhythm: Normal rate and regular rhythm.  Pulmonary:     Effort: Pulmonary effort is normal.     Breath sounds:  Normal breath sounds.  Neurological:     Mental Status: He is alert.  Psychiatric:        Mood and Affect: Mood normal.        Behavior: Behavior normal.     ------------------------------------------------------------------------------------------------------------------------------------------------------------------------------------------------------------------- Assessment and Plan  Psoriasis Referral to rheumatology as he may be having some manifestation of psoriatic arthritis.  Would like to discuss biologics.   Nocturia more than twice per night Updated PSA and check UA.    Hypertension associated with diabetes (HCC) BP remains well controlled.  Continue current medications for management of HTN.   No orders of the defined types were placed in this encounter.   No follow-ups on file.    This visit occurred during the SARS-CoV-2 public health emergency.  Safety protocols were in place, including screening questions prior to the visit, additional usage of staff PPE, and extensive cleaning of exam room while observing appropriate contact time as indicated for disinfecting solutions.

## 2022-11-02 NOTE — Assessment & Plan Note (Signed)
Referral to rheumatology as he may be having some manifestation of psoriatic arthritis.  Would like to discuss biologics.

## 2022-11-02 NOTE — Assessment & Plan Note (Signed)
BP remains well controlled.  Continue current medications for management of HTN.   

## 2022-11-02 NOTE — Assessment & Plan Note (Signed)
Updated PSA and check UA.

## 2022-11-03 LAB — URINALYSIS, ROUTINE W REFLEX MICROSCOPIC
Bacteria, UA: NONE SEEN /HPF
Bilirubin Urine: NEGATIVE
Glucose, UA: NEGATIVE
Hgb urine dipstick: NEGATIVE
Hyaline Cast: NONE SEEN /LPF
Leukocytes,Ua: NEGATIVE
Nitrite: NEGATIVE
Specific Gravity, Urine: 1.024 (ref 1.001–1.035)
Squamous Epithelial / HPF: NONE SEEN /HPF (ref <=5)
WBC, UA: NONE SEEN /HPF (ref 0–5)
pH: 5.5 (ref 5.0–8.0)

## 2022-11-03 LAB — PSA, TOTAL AND FREE
PSA, % Free: 18 % (calc) — ABNORMAL LOW (ref >25)
PSA, Free: 0.4 ng/mL
PSA, Total: 2.2 ng/mL (ref <=4.0)

## 2022-11-05 ENCOUNTER — Encounter: Payer: Federal, State, Local not specified - PPO | Admitting: Family Medicine

## 2022-11-06 ENCOUNTER — Other Ambulatory Visit: Payer: Self-pay | Admitting: Family Medicine

## 2022-11-06 MED ORDER — TAMSULOSIN HCL 0.4 MG PO CAPS: 0.4000 mg | ORAL_CAPSULE | Freq: Every day | ORAL | 3 refills | Status: DC

## 2022-11-06 NOTE — Progress Notes (Signed)
Rx sent in.  ? ?Thanks! ? ?CM

## 2022-11-25 ENCOUNTER — Encounter: Payer: Self-pay | Admitting: Family Medicine

## 2022-11-25 ENCOUNTER — Ambulatory Visit: Payer: Federal, State, Local not specified - PPO | Admitting: Family Medicine

## 2022-11-25 VITALS — BP 104/64 | HR 108 | Ht 69.0 in | Wt 250.0 lb

## 2022-11-25 DIAGNOSIS — R351 Nocturia: Secondary | ICD-10-CM

## 2022-11-25 DIAGNOSIS — Z0000 Encounter for general adult medical examination without abnormal findings: Secondary | ICD-10-CM

## 2022-11-25 DIAGNOSIS — R3915 Urgency of urination: Secondary | ICD-10-CM

## 2022-11-25 NOTE — Patient Instructions (Signed)

## 2022-11-25 NOTE — Progress Notes (Signed)
Nicholas Hoover - 62 y.o. male MRN [id number]  Date of birth: March 14, 1961  Subjective Chief Complaint  Patient presents with   Annual Exam    HPI Nicholas Hoover is a 62 y.o. male here today for annual exam  Overall doing pretty well.  Continues to experience some nocturia.  Interested in referral to urology.  Previous labs reviewed with him.  He was moderately active.  Feels like diet is pretty good.  He has a non-smoker.  Denies alcohol use.  Review of Systems  Constitutional:  Negative for chills, fever, malaise/fatigue and weight loss.  HENT:  Negative for congestion, ear pain and sore throat.   Eyes:  Negative for blurred vision, double vision and pain.  Respiratory:  Negative for cough and shortness of breath.   Cardiovascular:  Negative for chest pain and palpitations.  Gastrointestinal:  Negative for abdominal pain, blood in stool, constipation, heartburn and nausea.  Genitourinary:  Negative for dysuria and urgency.  Musculoskeletal:  Negative for joint pain and myalgias.  Neurological:  Negative for dizziness and headaches.  Endo/Heme/Allergies:  Does not bruise/bleed easily.  Psychiatric/Behavioral:  Negative for depression. The patient is not nervous/anxious and does not have insomnia.    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   Situational depression 04/01/2018   Stroke (HCC)    Type 1 diabetes mellitus with hyperlipidemia (HCC) 04/01/2018    Past Surgical History:  Procedure Laterality Date   VENTRICULAR ATRIAL SHUNT Right 2006   Procedure done at [facility]    Social History   Socioeconomic History    Marital status: Married    Spouse name: Not on file   Number of children: Not on file   Years of education: Not on file   Highest education level: Not on file  Occupational History   Not on file  Tobacco Use   Smoking status: Never   Smokeless tobacco: Never  Vaping Use   Vaping status: Never Used  Substance and Sexual Activity   Alcohol use: Not Currently   Drug use: Never   Sexual activity: Not Currently  Other Topics Concern   Not on file  Social History Narrative   Not on file   Social Determinants of Health   Financial Resource Strain: Low Risk  (06/25/2022)   Received from [facility], [facility]   Overall Financial Resource Strain (CARDIA)    Difficulty of Paying Living Expenses: Not very hard  Food Insecurity: No Food Insecurity (06/25/2022)   Received from [facility], [facility]   Hunger Vital Sign    Worried About Running Out of Food in the Last Year: Never true    Ran Out of Food in the Last Year: Never true  Transportation Needs: No Transportation Needs (06/25/2022)   Received from [facility], [facility]   PRAPARE - Transportation    Lack of Transportation (Medical): No    Lack of Transportation (Non-Medical): No  Physical Activity: Not on file  Stress: Not on file  Social Connections: Unknown (09/08/2021)   Received from [facility], [facility]   Social Network    Social Network: Not on  file    Family History  Problem Relation Age of Onset   Cancer Mother    Lung cancer Mother    Heart attack Father    Diabetes Sister    Diabetes Brother    Diabetes Sister     Health Maintenance  Topic Date Due   INFLUENZA VACCINE  11/26/2022   COVID-19 Vaccine (4 - 2023-24 season) 12/11/2022 (Originally 12/26/2021)   Zoster Vaccines- Shingrix (1 of 2) 02/02/2023 (Originally 10/15/2012)   OPHTHALMOLOGY EXAM  02/09/2023 (Originally 10/15/1972)   Colonoscopy  11/25/2023 (Originally 10/16/2022)   Hepatitis C Screening  11/25/2023 (Originally  10/15/1980)   HEMOGLOBIN A1C  04/07/2023   Diabetic kidney evaluation - eGFR measurement  10/06/2023   Diabetic kidney evaluation - Urine ACR  10/06/2023   FOOT EXAM  11/25/2023   DTaP/Tdap/Td (4 - Td or Tdap) 10/22/2031   HIV Screening  Completed   HPV VACCINES  Aged Out     ----------------------------------------------------------------------------------------------------------------------------------------------------------------------------------------------------------------- Physical Exam BP 104/64 (BP Location: Left Arm, Patient Position: Sitting, Cuff Size: Large)   Pulse (!) 108   Ht 5\' 9"  (1.753 m)   Wt 250 lb (113.4 kg)   SpO2 95%   BMI 36.92 kg/m   Physical Exam Constitutional:      General: He is not in acute distress. HENT:     Head: Normocephalic and atraumatic.     Right Ear: Tympanic membrane and external ear normal.     Left Ear: Tympanic membrane and external ear normal.  Eyes:     General: No scleral icterus. Neck:     Thyroid: No thyromegaly.  Cardiovascular:     Rate and Rhythm: Normal rate and regular rhythm.     Heart sounds: Normal heart sounds.  Pulmonary:     Effort: Pulmonary effort is normal.     Breath sounds: Normal breath sounds.  Abdominal:     General: Bowel sounds are normal. There is no distension.     Palpations: Abdomen is soft.     Tenderness: There is no abdominal tenderness. There is no guarding.  Musculoskeletal:     Cervical back: Normal range of motion.  Lymphadenopathy:     Cervical: No cervical adenopathy.  Skin:    General: Skin is warm and dry.     Findings: No rash.  Neurological:     Mental Status: He is alert and oriented to person, place, and time.     Cranial Nerves: No cranial nerve deficit.     Motor: No abnormal muscle tone.  Psychiatric:        Mood and Affect: Mood normal.        Behavior: Behavior normal.      ------------------------------------------------------------------------------------------------------------------------------------------------------------------------------------------------------------------- Assessment and Plan  Well adult exam Well adult Previous labs reviewed with him.  Referral placed to urology for continued nocturia. Screenings: Up-to-date Immunizations: Up-to-date Anticipatory guidance/risk factor reduction: Recommendations per AVS   No orders of the defined types were placed in this encounter.   Return in about 6 months (around 05/28/2023) for HTN.    This visit occurred during the SARS-CoV-2 public health emergency.  Safety protocols were in place, including screening questions prior to the visit, additional usage of staff PPE, and extensive cleaning of exam room while observing appropriate contact time as indicated for disinfecting solutions.

## 2022-11-29 NOTE — Assessment & Plan Note (Signed)
Well adult Previous labs reviewed with him.  Referral placed to urology for continued nocturia. Screenings: Up-to-date Immunizations: Up-to-date Anticipatory guidance/risk factor reduction: Recommendations per AVS

## 2022-12-14 ENCOUNTER — Ambulatory Visit: Payer: Federal, State, Local not specified - PPO | Admitting: Urology

## 2022-12-14 ENCOUNTER — Encounter: Payer: Self-pay | Admitting: Urology

## 2022-12-14 VITALS — BP 111/74 | HR 109 | Ht 69.0 in | Wt 250.0 lb

## 2022-12-14 DIAGNOSIS — R8290 Unspecified abnormal findings in urine: Secondary | ICD-10-CM

## 2022-12-14 DIAGNOSIS — N529 Male erectile dysfunction, unspecified: Secondary | ICD-10-CM

## 2022-12-14 DIAGNOSIS — R351 Nocturia: Secondary | ICD-10-CM | POA: Diagnosis not present

## 2022-12-14 LAB — URINALYSIS, ROUTINE W REFLEX MICROSCOPIC
Bilirubin, UA: NEGATIVE
Glucose, UA: NEGATIVE
Leukocytes,UA: NEGATIVE
Nitrite, UA: NEGATIVE
RBC, UA: NEGATIVE
Specific Gravity, UA: >1.03 — ABNORMAL HIGH (ref 1.005–1.030)
Urobilinogen, Ur: 1 mg/dL (ref 0.2–1.0)
pH, UA: 5.5 (ref 5.0–7.5)

## 2022-12-14 LAB — MICROSCOPIC EXAMINATION

## 2022-12-14 LAB — BLADDER SCAN AMB NON-IMAGING

## 2022-12-14 MED ORDER — GEMTESA 75 MG PO TABS
75.0000 mg | ORAL_TABLET | Freq: Every day | ORAL | Status: DC
Start: 2022-12-14 — End: 2023-03-08

## 2022-12-14 NOTE — Progress Notes (Signed)
Assessment: 1. Nocturia   2. Abnormal urine findings   3. Organic impotence     Plan: I personally reviewed the patient's chart including provider notes, and lab results. Urine culture sent today Trial of Gemtesa 75 mg daily.  Samples given. I discussed other options for management of his erectile dysfunction including vacuum erection device, penile injections, intraurethral suppository and penile prosthesis. Return to office in 1 month  Chief Complaint:  Chief Complaint  Patient presents with   Urinary Frequency    History of Present Illness:  Nicholas Hoover is a 62 y.o. male who is seen in consultation from Everrett Coombe, DO for evaluation of lower urinary tract symptoms.  He has had symptoms for a number of years.  He has daytime frequency voiding every 2-3 hours.  He also has nocturia voiding every 2 hours overnight.  He does report urgency with occasional urge incontinence.  He voids with a good stream.  No dysuria or gross hematuria.  No history of UTIs.  He was placed on tamsulosin approximately 1 month ago and has noted a slight improvement in his symptoms.  PSA 7/24:  2.2 with 18% free.  He has a history of a stroke x 2, diabetes, prior brain surgery for sarcoidosis with a VP shunt.  He reports problems with achieving and maintaining his erections since his brain surgery in 2005.  He has an occasional spontaneous erection.  He has previously tried oral medications with sildenafil and tadalafil without benefit.  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   Situational depression 04/01/2018   Stroke (HCC)    Type 1 diabetes mellitus with  hyperlipidemia (HCC) 04/01/2018    Past Surgical History:  Past Surgical History:  Procedure Laterality Date   VENTRICULAR ATRIAL SHUNT Right 2006   Procedure done at [facility]    Allergies:  No Known Allergies  Family History:  Family History  Problem Relation Age of Onset   Cancer Mother    Lung cancer Mother    Heart attack Father    Diabetes Sister    Diabetes Brother    Diabetes Sister     Social History:  Social History   Tobacco Use   Smoking status: Never   Smokeless tobacco: Never  Vaping Use   Vaping status: Never Used  Substance Use Topics   Alcohol use: Not Currently   Drug use: Never    Review of symptoms:  Constitutional:  Negative for unexplained weight loss, night sweats, fever, chills ENT:  Negative for nose bleeds, sinus pain, painful swallowing CV:  Negative for chest pain, shortness of breath, exercise intolerance, palpitations, loss of consciousness Resp:  Negative for cough, wheezing, shortness of breath GI:  Negative for nausea, vomiting, diarrhea, bloody stools GU:  Positives noted in HPI; otherwise negative for gross hematuria, dysuria Neuro:  Negative for seizures, poor balance, limb weakness, slurred speech Psych:  Negative for lack of energy, depression, anxiety Endocrine:  Negative for polydipsia, polyuria, symptoms of hypoglycemia (dizziness, hunger, sweating) Hematologic:  Negative for anemia, purpura, petechia, prolonged or excessive bleeding, use of anticoagulants  Allergic:  Negative for difficulty breathing or  choking as a result of exposure to anything; no shellfish allergy; no allergic response (rash/itch) to materials, foods  Physical exam: BP 111/74   Pulse (!) 109   Ht 5\' 9"  (1.753 m)   Wt 250 lb (113.4 kg)   BMI 36.92 kg/m  GENERAL APPEARANCE:  Well appearing, well developed, well nourished, NAD HEENT: Atraumatic, Normocephalic, oropharynx clear. NECK: Supple without lymphadenopathy or  thyromegaly. LUNGS: Clear to auscultation bilaterally. HEART: Regular Rate and Rhythm without murmurs, gallops, or rubs. ABDOMEN: Soft, non-tender, No Masses. EXTREMITIES: Right hemiparesis; Without clubbing, cyanosis, or edema. NEUROLOGIC:  Alert and oriented x 3, normal gait, CN II-XII grossly intact.  MENTAL STATUS:  Appropriate. BACK:  Non-tender to palpation.  No CVAT SKIN:  Warm, dry and intact.   GU: Penis:  uncircumcised Meatus: Normal Scrotum: normal, no masses Testis: normal without masses bilateral Prostate: 40 g, NT, no nodules Rectum: Normal tone,  no masses or tenderness   Results: U/A:0-5 WBC, 3-10 RBC, mod bacteria, + yeast  PVR =  0 ml

## 2022-12-16 LAB — URINE CULTURE: Organism ID, Bacteria: NO GROWTH

## 2022-12-17 ENCOUNTER — Ambulatory Visit (INDEPENDENT_AMBULATORY_CARE_PROVIDER_SITE_OTHER): Payer: Federal, State, Local not specified - PPO

## 2022-12-17 ENCOUNTER — Ambulatory Visit: Payer: Federal, State, Local not specified - PPO | Attending: Internal Medicine | Admitting: Internal Medicine

## 2022-12-17 ENCOUNTER — Encounter: Payer: Self-pay | Admitting: Internal Medicine

## 2022-12-17 VITALS — BP 136/78 | HR 80 | Resp 14 | Ht 69.5 in | Wt 250.0 lb

## 2022-12-17 DIAGNOSIS — M25562 Pain in left knee: Secondary | ICD-10-CM

## 2022-12-17 DIAGNOSIS — L409 Psoriasis, unspecified: Secondary | ICD-10-CM

## 2022-12-17 DIAGNOSIS — G8929 Other chronic pain: Secondary | ICD-10-CM

## 2022-12-17 DIAGNOSIS — D8689 Sarcoidosis of other sites: Secondary | ICD-10-CM | POA: Diagnosis not present

## 2022-12-17 DIAGNOSIS — D869 Sarcoidosis, unspecified: Secondary | ICD-10-CM | POA: Diagnosis not present

## 2022-12-17 NOTE — Progress Notes (Signed)
Office Visit Note  Patient: Nicholas Hoover             Date of Birth: 01/08/1961           MRN: [id number]             PCP: Everrett Coombe, DO Referring: Everrett Coombe, DO Visit Date: 12/17/2022   Subjective:  New Patient (Initial Visit) (Patient states he has pain in his knees but the left knee is worse. Patient states he has rashes on his arms and neck. Patient states he did have a stroke affecting his right side. )   History of Present Illness: Nicholas Hoover is a 62 y.o. male here for evaluation of left knee pain concern for psoriatic arthritis.  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  On long-term statin and Plavix.  Problem with skin rash on right elbow and forearm starting since about 2 years ago.  This initially started elbow and has become more extensive over time. Has developed some rash on the left forearm as well and these are itchy.   He treated this with topical steroids with only a partial benefit.  Skin biopsy was obtained April 2022 possible for psoriasis.  He never saw dermatology and has not taken any systemic therapies. Left knee pain has started since about 1 year ago progressively worsening over time.  Notices a lot of joint pain and stiffness first thing in the morning and gets partial improvement once he is up and moving during the day.  See swelling sometimes does not experience redness or warmth and no skin rash around the knee.  He takes ibuprofen for this sometimes as needed but not every day.  Morning stiffness lasting for several minutes daily.  08/14/20 Skin biopsy There is a psoriasiform and spongiotic tissue reaction. There is parakeratin on the surface. There is exocytosis. A  superficial infiltrate of mononuclear cells is seen. I see neither diagnostic spongioform pustules nor well developed Munro microabscesses. In this setting, though psoriasis enters the differential diagnosis, the spongiosis and superficial changes in the stratum corneum are more typical of a chronic eczematous reaction such as a contact dermatitis, Id reaction, or nummular eczema. Following review of the hematoxylin and eosin sections, a PAS stain was obtained to exclude a fungal infection. The PAS stain is negative for fungal organisms.  Activities of Daily Living:  Patient reports morning stiffness for 5-10 minutes.   Patient Denies nocturnal pain.  Difficulty dressing/grooming: Denies Difficulty climbing stairs: Reports Difficulty getting out of chair: Reports Difficulty using hands for taps, buttons, cutlery, and/or writing: Reports  Review of Systems  Constitutional:  Positive for fatigue.  HENT:  Negative for mouth sores and mouth dryness.   Eyes:  Negative for dryness.  Respiratory:  Negative for shortness of breath.   Cardiovascular:  Negative for chest pain and palpitations.  Gastrointestinal:  Negative for blood in stool, constipation and diarrhea.  Endocrine: Positive for increased urination.  Genitourinary:  Negative for involuntary urination.  Musculoskeletal:  Positive for joint pain, gait problem, joint pain and morning stiffness. Negative for joint swelling, myalgias, muscle weakness, muscle tenderness and myalgias.  Skin:  Positive for rash. Negative for color change, hair loss and sensitivity to sunlight.  Allergic/Immunologic: Negative for susceptible to infections.  Neurological:  Negative for dizziness and headaches.  Hematological:  Negative for swollen glands.  Psychiatric/Behavioral:  Positive for sleep disturbance. Negative for depressed mood. The patient is not nervous/anxious.     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   Diabetic mononeuropathy associated with type 1 diabetes mellitus (HCC) 04/06/2018   Obstructive sleep apnea    History of multiple strokes 04/01/2018   Dysarthria 04/01/2018   Hypertension associated with diabetes (HCC) 04/01/2018   Type 1 diabetes mellitus with complications (HCC) 04/01/2018   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 04/01/2018   Sarcoidosis 04/01/2018   Situational depression 04/01/2018   History of DVT of lower extremity 04/27/2004    Past Medical History:  Diagnosis Date   Diabetes mellitus without complication (HCC)    History of DVT of lower extremity 2006   History of multiple strokes 04/01/2018   HTN (hypertension) 04/01/2018   Left pontine CVA (HCC) 02/2016   Obstructive sleep apnea    Psoriasis    Sarcoidosis 04/01/2018   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 04/01/2018   Situational depression 04/01/2018   Stroke (HCC)    Type 1 diabetes mellitus with hyperlipidemia (HCC) 04/01/2018    Family History  Problem Relation Age of Onset   Cancer Mother    Lung cancer Mother    Heart attack Father    Diabetes Sister    Diabetes Brother    Diabetes Sister    Past Surgical History:  Procedure Laterality Date   VENTRICULAR ATRIAL SHUNT Right 2006   Procedure done at [facility]   Social History   Social History Narrative   Not on file   Immunization History  Administered Date(s) Administered   Moderna Sars-Covid-2 Vaccination 07/29/2019, 08/26/2019, 03/12/2020   Pneumococcal Polysaccharide-23 05/08/2008, 04/06/2018   Td 05/11/2001   Tdap 06/30/2011, 10/21/2021      Objective: Vital Signs: BP 136/78 (BP Location: Left Arm, Patient Position: Sitting, Cuff Size: Normal) Comment (BP Location): Patient has had stroke, only checking left arm  Pulse 80   Resp 14   Ht 5' 9.5" (1.765 m)   Wt 250 lb (113.4 kg)   BMI 36.39 kg/m    Physical Exam Constitutional:      Appearance: He is obese.  HENT:     Mouth/Throat:     Mouth: Mucous membranes are moist.     Pharynx: Oropharynx is clear.  Eyes:     Conjunctiva/sclera: Conjunctivae normal.  Cardiovascular:     Rate and Rhythm: Normal rate and regular rhythm.  Pulmonary:     Effort: Pulmonary effort is normal.     Breath sounds: Normal breath sounds.  Musculoskeletal:     Right lower leg: Edema present.     Left lower leg: Edema present.     Comments: 1+ right trace left pitting pedal edema  Lymphadenopathy:     Cervical: No cervical adenopathy.  Skin:    General: Skin is warm and dry.     Findings: Rash present.     Comments: Hyperpigmented rash over right elbow and forearm extensor surface with some scaling, small patch about 1 inch  diameter on left forearm extensor surface No nail pitting or ridges  Neurological:     Mental Status: He is alert.  Psychiatric:        Mood and Affect: Mood normal.      Musculoskeletal Exam:  Neck full ROM no tenderness Right shoulder limited abduction external rotation and extension range of motion, no tenderness to pressure, left shoulder is normal Right elbow restricted flexion and extension better with passive and active motion Wrists full ROM no tenderness or swelling Right hand decreased grip strength limited active extension but passive range of motion is well-preserved Very poor hip internal rotation range of motion on both sides, no lateral hip tenderness to pressure Knees full ROM no tenderness or swelling, no joint laxity, negative patellar grind test Ankles full ROM no tenderness or swelling   Investigation: No additional  findings.  Imaging: XR KNEE 3 VIEW LEFT  Result Date: 12/17/2022 X-ray left knee 3 views Medial lateral and patellofemoral compartment joint spaces appear well-preserved.  There is very small inferior patellar enthesophyte.  There is chondrocalcinosis present.  Lateral border possible for some bulging of meniscus position or periarticular calcification.  Faint posterior vascular calcifications.  No visible joint effusion. 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 be consistent with CPPD but faint changes and does not have very strong clinical history   Recent Labs: Lab Results  Component Value Date   WBC 7.4 10/21/2021   HGB 13.5 10/21/2021   PLT 220 10/21/2021   NA 146 10/06/2022   K 4.0 10/06/2022   CL 109 (A) 10/06/2022   CO2 23 (A) 10/06/2022   GLUCOSE 168 (H) 10/21/2021   BUN 21 10/06/2022   CREATININE 1.1 10/06/2022   BILITOT 0.7 10/21/2021   ALKPHOS 66 04/01/2018   AST 26 10/21/2021   ALT 29 10/21/2021   PROT 7.0 10/21/2021   ALBUMIN 3.5 04/01/2018   CALCIUM 9.8 10/21/2021   GFRAA 100 08/14/2020    Speciality Comments: No specialty comments available.  Procedures:  No procedures performed Allergies: Patient has no known allergies.   Assessment / Plan:     Visit Diagnoses: Chronic pain of left knee - Plan: XR KNEE 3 VIEW LEFT, CBC with Differential/Platelet, Sedimentation rate, C-reactive protein  Severe morning symptoms partially improved on activity is suggestive for inflammation but there is no appreciable synovitis on exam today.  X-ray of the left knee does not show significant osteoarthritis.  Mild chondrocalcinosis present but no episodic inflammation to suggest pseudogout.  Checking sed rate and CRP for inflammatory activity assessment.  I deftly think there is some use related component as he relies on the left side heavily due to right sided neurodeficits also has very poor hip mobility.  May benefit more  conservative approach consider local steroid injection or physical therapy unless we see severely abnormal labs or change in symptoms on follow up.  Psoriasis   Large patch of skin rash on the right arm small area on left small total amount of body surface area affected.  Review of dermatopathology report cannot exclude other eczematous process.  Limited response to topical steroids so far he has not seen dermatology to explore any other treatment modality.  Without clear evidence of joint inflammation involvement would not start on systemic immunosuppressive medication unless symptoms worsen.  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  Will check ACE level but very low clinical suspicion for active disease.  Orders: Orders Placed This Encounter  Procedures   XR KNEE 3 VIEW LEFT   CBC with Differential/Platelet   Sedimentation rate   C-reactive protein   Angiotensin converting enzyme   No orders of the defined types were placed in this encounter.    Follow-Up Instructions: Return in about 4 weeks (around 01/14/2023) for New pt ?PsA f/u 58mo.   Fuller Plan, MD  Note - This record has been created using [employer].  Chart creation errors have been sought, but may not always  have been located. Such creation errors do not reflect on  the standard of medical care.

## 2022-12-18 LAB — CBC WITH DIFFERENTIAL/PLATELET
Absolute Monocytes: 528 {cells}/uL (ref 200–950)
Basophils Absolute: 40 {cells}/uL (ref 0–200)
Basophils Relative: 0.6 %
Eosinophils Absolute: 198 {cells}/uL (ref 15–500)
Eosinophils Relative: 3 %
HCT: 41.3 % (ref 38.5–50.0)
Hemoglobin: 13.5 g/dL (ref 13.2–17.1)
Lymphs Abs: 1577 {cells}/uL (ref 850–3900)
MCH: 30.8 pg (ref 27.0–33.0)
MCHC: 32.7 g/dL (ref 32.0–36.0)
MCV: 94.3 fL (ref 80.0–100.0)
MPV: 11.1 fL (ref 7.5–12.5)
Monocytes Relative: 8 %
Neutro Abs: 4257 {cells}/uL (ref 1500–7800)
Neutrophils Relative %: 64.5 %
Platelets: 216 10*3/uL (ref 140–400)
RBC: 4.38 10*6/uL (ref 4.20–5.80)
RDW: 13.3 % (ref 11.0–15.0)
Total Lymphocyte: 23.9 %
WBC: 6.6 10*3/uL (ref 3.8–10.8)

## 2022-12-18 LAB — SEDIMENTATION RATE: Sed Rate: 9 mm/h (ref 0–20)

## 2022-12-18 LAB — C-REACTIVE PROTEIN: CRP: <3 mg/L (ref <8.0)

## 2022-12-18 LAB — ANGIOTENSIN CONVERTING ENZYME: Angiotensin-Converting Enzyme: 66 U/L (ref 9–67)

## 2022-12-18 NOTE — Progress Notes (Signed)
Lab results are all normal no evidence of systemic inflammation from psoriasis at this time. There was also no evidence of active sarcoidosis. His knee xray does not show any severe osteoarthritis. There is some calcium deposition that could be contributing to joint inflammation. We can follow up as planned to take another look at this and discuss any treatments.

## 2022-12-24 DIAGNOSIS — Z8673 Personal history of transient ischemic attack (TIA), and cerebral infarction without residual deficits: Secondary | ICD-10-CM | POA: Diagnosis not present

## 2022-12-24 DIAGNOSIS — E1069 Type 1 diabetes mellitus with other specified complication: Secondary | ICD-10-CM | POA: Diagnosis not present

## 2022-12-24 DIAGNOSIS — G8191 Hemiplegia, unspecified affecting right dominant side: Secondary | ICD-10-CM | POA: Diagnosis not present

## 2022-12-25 ENCOUNTER — Other Ambulatory Visit: Payer: Self-pay

## 2022-12-25 MED ORDER — CLOPIDOGREL BISULFATE 75 MG PO TABS: ORAL_TABLET | ORAL | 3 refills | Status: DC

## 2023-01-11 ENCOUNTER — Ambulatory Visit: Payer: Federal, State, Local not specified - PPO | Admitting: Urology

## 2023-01-11 ENCOUNTER — Encounter: Payer: Self-pay | Admitting: Urology

## 2023-01-11 VITALS — BP 111/75 | HR 118

## 2023-01-11 DIAGNOSIS — N529 Male erectile dysfunction, unspecified: Secondary | ICD-10-CM

## 2023-01-11 DIAGNOSIS — R351 Nocturia: Secondary | ICD-10-CM

## 2023-01-11 DIAGNOSIS — R350 Frequency of micturition: Secondary | ICD-10-CM

## 2023-01-11 LAB — URINALYSIS, ROUTINE W REFLEX MICROSCOPIC
Bilirubin, UA: NEGATIVE
Glucose, UA: NEGATIVE
Ketones, UA: NEGATIVE
Leukocytes,UA: NEGATIVE
Nitrite, UA: NEGATIVE
Specific Gravity, UA: >1.03 — ABNORMAL HIGH (ref 1.005–1.030)
Urobilinogen, Ur: 1 mg/dL (ref 0.2–1.0)
pH, UA: 5.5 (ref 5.0–7.5)

## 2023-01-11 LAB — MICROSCOPIC EXAMINATION

## 2023-01-11 NOTE — Progress Notes (Addendum)
Assessment: 1. Nocturia   2. Urinary frequency   3. Organic impotence     Plan: Recommend further evaluation with a 24-hour voiding diary x 3 days to assess his nocturia. Will review and contact patient with recommendations.  01/21/23  Addendum: 24 hour voiding diary reviewed: I&O's well matched Nighttime urine production:  52%, 60%, 70%  I discussed these results with the patient by phone.  He has evidence of nocturnal polyuria.  Management with desmopressin discussed.  Potential side effects of hyponatremia reviewed with the patient. Will begin desmopressin 0.1 mg nightly. BMP in 2 weeks.   Chief Complaint  Patient presents with   Nocturia    History of Present Illness:  Nicholas Hoover is a 62 y.o. male who is seen for further evaluation of lower urinary tract symptoms.  He has had symptoms for a number of years.  He reported daytime frequency, voiding every 2-3 hours.  He also has had nocturia voiding every 2 hours overnight.  He reported urgency with occasional urge incontinence.  He voids with a good stream.  No dysuria or gross hematuria.  No history of UTIs.  He was placed on tamsulosin in July 2024 and noted a slight improvement in his symptoms.  PSA 7/24:  2.2 with 18% free.  He has a history of a stroke x 2, diabetes, prior brain surgery for sarcoidosis with a VP shunt.  He reported problems with achieving and maintaining his erections since his brain surgery in 2005.  He has an occasional spontaneous erection.  He has previously tried oral medications with sildenafil and tadalafil without benefit. PVR = 0 ml. He was given a trial of Gemtesa 75 mg daily at his visit in 8/24.  He returns today for follow-up.  He continues with some frequency and nocturia x 3-5.  He did not see any improvement in his symptoms with the [facility].  His primary complaint is the nighttime urination. No area or gross hematuria. IPSS = 12 today.  Portions of the above documentation were  copied from a prior visit for review purposes only.   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   Situational depression 04/01/2018   Stroke (HCC)    Type 1 diabetes mellitus with hyperlipidemia (HCC) 04/01/2018    Past Surgical History:  Past Surgical History:  Procedure Laterality Date   VENTRICULAR ATRIAL SHUNT Right 2006   Procedure done at [facility]    Allergies:  No Known Allergies  Family History:  Family History  Problem Relation Age of Onset   Cancer Mother    Lung cancer Mother    Heart attack Father    Diabetes Sister    Diabetes Brother    Diabetes Sister     Social History:  Social History   Tobacco Use   Smoking status: Never    Passive exposure: Never   Smokeless tobacco: Never  Vaping Use   Vaping status: Never Used  Substance Use Topics   Alcohol use: Not Currently   Drug use: Never    ROS: Constitutional:  Negative for fever, chills, weight loss CV: Negative for  chest pain, previous MI, hypertension Respiratory:  Negative for shortness of breath, wheezing, sleep apnea, frequent cough GI:  Negative for nausea, vomiting, bloody stool, GERD  Physical exam: BP 111/75   Pulse (!) 118  GENERAL APPEARANCE:  Well appearing, well developed, well nourished, NAD HEENT:  Atraumatic, normocephalic, oropharynx clear NECK:  Supple without lymphadenopathy or thyromegaly ABDOMEN:  Soft, non-tender, no masses EXTREMITIES:  Moves all extremities well, without clubbing, cyanosis, or edema NEUROLOGIC:  Alert and oriented x 3, CN II-XII grossly intact MENTAL STATUS:  appropriate BACK:   Non-tender to palpation, No CVAT SKIN:  Warm, dry, and intact   Results: U/A: 0-5 WBC, 0-2 RBC, few bacteria

## 2023-01-12 ENCOUNTER — Other Ambulatory Visit: Payer: Self-pay | Admitting: Family Medicine

## 2023-01-12 DIAGNOSIS — I152 Hypertension secondary to endocrine disorders: Secondary | ICD-10-CM

## 2023-01-13 ENCOUNTER — Other Ambulatory Visit: Payer: Self-pay

## 2023-01-13 DIAGNOSIS — I152 Hypertension secondary to endocrine disorders: Secondary | ICD-10-CM

## 2023-01-13 MED ORDER — ATORVASTATIN CALCIUM 80 MG PO TABS: ORAL_TABLET | ORAL | 3 refills | Status: DC

## 2023-01-13 MED ORDER — AMLODIPINE BESYLATE 10 MG PO TABS
10.0000 mg | ORAL_TABLET | Freq: Every day | ORAL | 3 refills | Status: DC
Start: 2023-01-13 — End: 2024-02-17

## 2023-01-13 MED ORDER — EZETIMIBE 10 MG PO TABS: 10.0000 mg | ORAL_TABLET | Freq: Every day | ORAL | 3 refills | Status: DC

## 2023-01-19 ENCOUNTER — Ambulatory Visit: Payer: Federal, State, Local not specified - PPO | Admitting: Internal Medicine

## 2023-01-21 MED ORDER — DESMOPRESSIN ACETATE 0.1 MG PO TABS
0.1000 mg | ORAL_TABLET | Freq: Every day | ORAL | 1 refills | Status: DC
Start: 2023-01-21 — End: 2023-03-08

## 2023-01-21 NOTE — Addendum Note (Signed)
Addended by: Milderd Meager on: 01/21/2023 12:51 PM   Modules accepted: Orders

## 2023-01-22 ENCOUNTER — Ambulatory Visit: Payer: Federal, State, Local not specified - PPO | Attending: Internal Medicine | Admitting: Internal Medicine

## 2023-01-22 ENCOUNTER — Encounter: Payer: Self-pay | Admitting: Internal Medicine

## 2023-01-22 VITALS — BP 128/75 | HR 91 | Resp 14 | Ht 69.0 in | Wt 257.0 lb

## 2023-01-22 DIAGNOSIS — G8929 Other chronic pain: Secondary | ICD-10-CM | POA: Diagnosis not present

## 2023-01-22 DIAGNOSIS — L409 Psoriasis, unspecified: Secondary | ICD-10-CM | POA: Diagnosis not present

## 2023-01-22 DIAGNOSIS — D869 Sarcoidosis, unspecified: Secondary | ICD-10-CM | POA: Diagnosis not present

## 2023-01-22 DIAGNOSIS — M25562 Pain in left knee: Secondary | ICD-10-CM | POA: Insufficient documentation

## 2023-01-22 MED ORDER — TRIAMCINOLONE ACETONIDE 40 MG/ML IJ SUSP
40.0000 mg | INTRAMUSCULAR | Status: AC | PRN
Start: 2023-01-22 — End: 2023-01-22
  Administered 2023-01-22: 40 mg via INTRA_ARTICULAR

## 2023-01-22 MED ORDER — LIDOCAINE HCL 1 % IJ SOLN
2.0000 mL | INTRAMUSCULAR | Status: AC | PRN
Start: 2023-01-22 — End: 2023-01-22
  Administered 2023-01-22: 2 mL

## 2023-01-25 ENCOUNTER — Encounter: Payer: Self-pay | Admitting: Urology

## 2023-02-01 ENCOUNTER — Other Ambulatory Visit: Payer: Federal, State, Local not specified - PPO

## 2023-02-01 DIAGNOSIS — R351 Nocturia: Secondary | ICD-10-CM | POA: Diagnosis not present

## 2023-02-02 ENCOUNTER — Encounter: Payer: Self-pay | Admitting: Urology

## 2023-02-02 LAB — BASIC METABOLIC PANEL
BUN/Creatinine Ratio: 14 (ref 10–24)
BUN: 13 mg/dL (ref 8–27)
CO2: 21 mmol/L (ref 20–29)
Calcium: 9.1 mg/dL (ref 8.6–10.2)
Chloride: 108 mmol/L — ABNORMAL HIGH (ref 96–106)
Creatinine, Ser: 0.92 mg/dL (ref 0.76–1.27)
Glucose: 110 mg/dL — ABNORMAL HIGH (ref 70–99)
Potassium: 4 mmol/L (ref 3.5–5.2)
Sodium: 147 mmol/L — ABNORMAL HIGH (ref 134–144)
eGFR: 95 mL/min/{1.73_m2} (ref >59)

## 2023-02-08 ENCOUNTER — Telehealth: Payer: Self-pay | Admitting: Urology

## 2023-02-08 NOTE — Telephone Encounter (Signed)
Dr Pete Glatter wants patient to come back in 1 month for a follow up. Left a voicemail for the patient to call us back to get this scheduled.

## 2023-03-08 ENCOUNTER — Encounter: Payer: Self-pay | Admitting: Urology

## 2023-03-08 ENCOUNTER — Ambulatory Visit: Payer: Federal, State, Local not specified - PPO | Admitting: Urology

## 2023-03-08 VITALS — BP 133/81 | HR 112 | Ht 69.0 in | Wt 255.0 lb

## 2023-03-08 DIAGNOSIS — R351 Nocturia: Secondary | ICD-10-CM

## 2023-03-08 DIAGNOSIS — R350 Frequency of micturition: Secondary | ICD-10-CM

## 2023-03-08 LAB — URINALYSIS, ROUTINE W REFLEX MICROSCOPIC
Bilirubin, UA: NEGATIVE
Ketones, UA: NEGATIVE
Leukocytes,UA: NEGATIVE
Nitrite, UA: NEGATIVE
Specific Gravity, UA: >1.03 — ABNORMAL HIGH (ref 1.005–1.030)
Urobilinogen, Ur: 0.2 mg/dL (ref 0.2–1.0)
pH, UA: 5.5 (ref 5.0–7.5)

## 2023-03-08 LAB — MICROSCOPIC EXAMINATION

## 2023-03-08 MED ORDER — DESMOPRESSIN ACETATE 0.2 MG PO TABS: 0.2000 mg | ORAL_TABLET | Freq: Every day | ORAL | 11 refills | Status: DC

## 2023-03-08 NOTE — Progress Notes (Signed)
Assessment: 1. Nocturia   2. Urinary frequency     Plan: Increase DDAVP to 0.2 mg nightly. BMP in 2 weeks. Return to office in 6 weeks.  Chief Complaint: Chief Complaint  Patient presents with   Urinary Frequency    History of Present Illness:  Nicholas Hoover is a 62 y.o. male who is seen for further evaluation of lower urinary tract symptoms.  He has had symptoms for a number of years.  He reported daytime frequency, voiding every 2-3 hours.  He also has had nocturia voiding every 2 hours overnight.  He reported urgency with occasional urge incontinence.  He voids with a good stream.  No dysuria or gross hematuria.  No history of UTIs.  He was placed on tamsulosin in July 2024 and noted a slight improvement in his symptoms.  PSA 7/24:  2.2 with 18% free.  He has a history of a stroke x 2, diabetes, prior brain surgery for sarcoidosis with a VP shunt.  He reported problems with achieving and maintaining his erections since his brain surgery in 2005.  He has an occasional spontaneous erection.  He has previously tried oral medications with sildenafil and tadalafil without benefit. PVR = 0 ml. He was given a trial of Gemtesa 75 mg daily at his visit in 8/24. He continued with some frequency and nocturia x 3-5.  He did not see any improvement in his symptoms with the [facility].  His primary complaint continued to be nighttime urination. No area or gross hematuria. IPSS = 12.  24 hour voiding diary showed that his I&O's were well matched.  Nighttime urine production:  52%, 60%, 70% He was started on desmopressin 0.1 mg nightly. BMP from 02/01/23 showed a Na of 147.  She returns today for follow-up.  He continues on DDAVP 0.1 mg nightly.  He continues to have nocturia 4-5 times.  No side effects from the medication.  He reports that his daytime frequency is not bothersome.  He also continues on tamsulosin. No dysuria or gross hematuria. IPSS = 18.  Portions of the above documentation  were copied from a prior visit for review purposes only.   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   Situational depression 04/01/2018   Stroke (HCC)    Type 1 diabetes mellitus with hyperlipidemia (HCC) 04/01/2018    Past Surgical History:  Past Surgical History:  Procedure Laterality Date   VENTRICULAR ATRIAL SHUNT Right 2006   Procedure done at [facility]    Allergies:  No Known Allergies  Family History:  Family History  Problem Relation Age of Onset   Cancer Mother    Lung cancer Mother    Heart attack Father    Diabetes Sister    Diabetes Brother    Diabetes Sister     Social History:  Social History   Tobacco Use   Smoking status: Never    Passive exposure: Never   Smokeless tobacco: Never  Vaping Use   Vaping status: Never Used  Substance Use Topics   Alcohol use: Not Currently   Drug use: Never  ROS: Constitutional:  Negative for fever, chills, weight loss CV: Negative for chest pain, previous MI, hypertension Respiratory:  Negative for shortness of breath, wheezing, sleep apnea, frequent cough GI:  Negative for nausea, vomiting, bloody stool, GERD  Physical exam: BP 133/81   Pulse (!) 112   Ht 5\' 9"  (1.753 m)   Wt 255 lb (115.7 kg)   BMI 37.66 kg/m  GENERAL APPEARANCE:  Well appearing, well developed, well nourished, NAD HEENT:  Atraumatic, normocephalic, oropharynx clear NECK:  Supple without lymphadenopathy or thyromegaly ABDOMEN:  Soft, non-tender, no masses EXTREMITIES:  Moves all extremities well, without clubbing, cyanosis, or edema NEUROLOGIC:  Alert and oriented  x 3, normal gait, CN II-XII grossly intact MENTAL STATUS:  appropriate BACK:  Non-tender to palpation, No CVAT SKIN:  Warm, dry, and intact   Results: U/A: 0 WBCs, 0-2 RBCs , 3+ glucose

## 2023-03-17 ENCOUNTER — Other Ambulatory Visit: Payer: Self-pay | Admitting: Family Medicine

## 2023-03-22 ENCOUNTER — Other Ambulatory Visit: Payer: Federal, State, Local not specified - PPO

## 2023-03-22 ENCOUNTER — Other Ambulatory Visit: Payer: Self-pay | Admitting: Urology

## 2023-03-22 DIAGNOSIS — R351 Nocturia: Secondary | ICD-10-CM | POA: Diagnosis not present

## 2023-03-23 ENCOUNTER — Encounter: Payer: Self-pay | Admitting: Urology

## 2023-03-23 LAB — BASIC METABOLIC PANEL
BUN/Creatinine Ratio: 20 (ref 10–24)
BUN: 23 mg/dL (ref 8–27)
CO2: 21 mmol/L (ref 20–29)
Calcium: 9.6 mg/dL (ref 8.6–10.2)
Chloride: 106 mmol/L (ref 96–106)
Creatinine, Ser: 1.14 mg/dL (ref 0.76–1.27)
Glucose: 253 mg/dL — ABNORMAL HIGH (ref 70–99)
Potassium: 4.7 mmol/L (ref 3.5–5.2)
Sodium: 144 mmol/L (ref 134–144)
eGFR: 74 mL/min/{1.73_m2} (ref >59)

## 2023-03-30 ENCOUNTER — Encounter: Payer: Self-pay | Admitting: Family Medicine

## 2023-04-02 NOTE — Progress Notes (Signed)
Office Visit Note  Patient: Nicholas Hoover             Date of Birth: [birth year]           MRN: [id number]             PCP: Everrett Coombe, DO Referring: Everrett Coombe, DO Visit Date: 04/16/2023   Subjective:  Follow-up   Discussed the use of AI scribe software for clinical note transcription with the patient, who gave verbal consent to proceed.  History of Present Illness   Nicholas Hoover is a 62 y.o. male here for follow up for arthritis particularly left knee pain workup at our initial visit was negative for any antibody or inflammatory marker blood test.  He presents for a follow-up visit after receiving an injection in September for this that was unclear about possible OA vs CPPD vs PsA as the main cause. They reported feeling fine and not experiencing any discomfort in the knee since the injection. However, they noted swelling in the right knee that started approximately two weeks prior to this visit. The patient denied any pain associated with the swelling. They also mentioned a past medical history of a blood clot in the same leg.  The patient has been maintaining an active lifestyle, trying to strengthen their legs through exercises such as getting up from a sitting position without using their hands for support. They expressed a willingness to continue working on their own to improve their condition, with the understanding that increased activity might initially cause some inflammation in the knee. They were open to the idea of receiving another injection if the knee condition worsens in the future.     Previous HPI 01/22/2023 Nicholas Hoover is a 62 y.o. male here for follow up for arthritis particularly left knee pain workup at our initial visit was negative for any antibody or inflammatory marker blood test.  X-ray was consistent with mild degenerative changes also with chondrocalcinosis present.  Overall feels his symptoms are doing okay but left knee pain still noticeable daily.   Worst is first thing in the morning he is stiff has to wait a while to stretch out his knee and walk slowly as the day goes on stiffness decreases.  Taking ibuprofen as needed most days.   Previous HPI 12/17/22 Nicholas Hoover is a 62 y.o. male here for evaluation of left knee pain concern for psoriatic arthritis.  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  On long-term statin and Plavix.  Problem with skin rash on right elbow and forearm starting since about 2 years ago.  This initially started elbow and has become more extensive over time. Has developed some rash on the left forearm as well and these are itchy.   He treated this with topical steroids with only a partial benefit.  Skin biopsy was obtained April 2022 possible for psoriasis.  He never saw dermatology and has not taken any systemic therapies. Left knee pain has started since about 1 year ago progressively worsening over time.  Notices a lot of joint pain and stiffness first thing in the morning and gets partial improvement once he is up and moving during the day.  See swelling sometimes does not experience redness or warmth and no skin rash around the  knee.  He takes ibuprofen for this sometimes as needed but not every day.  Morning stiffness lasting for several minutes daily.   08/14/20 Skin biopsy There is a psoriasiform and spongiotic tissue reaction. There is parakeratin on the surface. There is exocytosis. A superficial infiltrate of mononuclear cells is seen. I see neither diagnostic spongioform pustules nor well developed Munro microabscesses. In this setting, though psoriasis enters the differential diagnosis, the spongiosis and superficial changes in the  stratum corneum are more typical of a chronic eczematous reaction such as a contact dermatitis, Id reaction, or nummular eczema. Following review of the hematoxylin and eosin sections, a PAS stain was obtained to exclude a fungal infection. The PAS stain is negative for fungal organisms.   Review of Systems  Constitutional:  Positive for fatigue.  HENT:  Negative for mouth sores and mouth dryness.   Eyes:  Negative for dryness.  Respiratory:  Negative for shortness of breath.   Cardiovascular:  Negative for chest pain and palpitations.  Gastrointestinal:  Negative for blood in stool, constipation and diarrhea.  Endocrine: Positive for increased urination.  Genitourinary:  Negative for involuntary urination.  Musculoskeletal:  Positive for gait problem, joint swelling and morning stiffness. Negative for joint pain, joint pain, myalgias, muscle weakness, muscle tenderness and myalgias.  Skin:  Negative for color change, rash, hair loss and sensitivity to sunlight.  Allergic/Immunologic: Negative for susceptible to infections.  Neurological:  Negative for dizziness and headaches.  Hematological:  Negative for swollen glands.  Psychiatric/Behavioral:  Positive for sleep disturbance. Negative for depressed mood. The patient is not nervous/anxious.     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   Diabetic mononeuropathy associated with type 1 diabetes mellitus (HCC) 04/06/2018   Obstructive sleep apnea    History of multiple strokes 04/01/2018   Dysarthria 04/01/2018   Hypertension associated with diabetes  (HCC) 04/01/2018   Type 1 diabetes mellitus with complications (HCC) 04/01/2018   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 04/01/2018   Sarcoidosis 04/01/2018   Situational depression 04/01/2018   History of DVT of lower extremity 04/27/2004    Past Medical History:  Diagnosis Date   Diabetes mellitus without complication (HCC)    History of DVT of lower extremity 2006   History of multiple strokes 04/01/2018   HTN (hypertension) 04/01/2018   Left pontine CVA (HCC) 02/2016   Obstructive sleep apnea    Psoriasis    Sarcoidosis 04/01/2018   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 04/01/2018   Situational depression 04/01/2018   Stroke (HCC)    Type 1 diabetes mellitus with hyperlipidemia (HCC) 04/01/2018    Family History  Problem Relation Age of Onset   Cancer Mother    Lung cancer Mother    Heart attack Father    Diabetes Sister    Diabetes Brother    Diabetes Sister    Past Surgical History:  Procedure Laterality Date   VENTRICULAR ATRIAL SHUNT Right 2006   Procedure done at [facility]   Social History   Social History Narrative   Not on file  Immunization History  Administered Date(s) Administered   Moderna Sars-Covid-2 Vaccination 07/29/2019, 08/26/2019, 03/12/2020   Pneumococcal Polysaccharide-23 05/08/2008, 04/06/2018   Td 05/11/2001   Tdap 06/30/2011, 10/21/2021     Objective: Vital Signs: BP 128/75 (BP Location: Left Arm, Patient Position: Sitting, Cuff Size: Normal)   Pulse 92   Resp 14   Ht 5\' 9"  (1.753 m)   Wt 253 lb (114.8 kg)   BMI 37.36 kg/m    Physical Exam Cardiovascular:     Rate and Rhythm: Normal rate and regular rhythm.  Pulmonary:     Effort: Pulmonary effort is normal.     Breath sounds: Normal breath sounds.  Skin:    General: Skin is warm and dry.  Neurological:     Mental Status: He is alert.      Musculoskeletal Exam:  Elbows full ROM no tenderness or swelling Wrists full  ROM no tenderness or swelling Fingers full ROM no tenderness or swelling Knees full ROM small right knee effusion, minimal tenderness to pressure worst anterior Ankles full ROM no tenderness or swelling  Limited MSKUS of right knee exhibits swelling, presence of a bone spur on the kneecap, a small amount of fluid   Investigation: No additional findings.  Imaging: No results found.  Recent Labs: Lab Results  Component Value Date   WBC 6.6 12/17/2022   HGB 13.5 12/17/2022   PLT 216 12/17/2022   NA 144 03/22/2023   K 4.7 03/22/2023   CL 106 03/22/2023   CO2 21 03/22/2023   GLUCOSE 253 (H) 03/22/2023   BUN 23 03/22/2023   CREATININE 1.14 03/22/2023   BILITOT 0.7 10/21/2021   ALKPHOS 66 04/01/2018   AST 26 10/21/2021   ALT 29 10/21/2021   PROT 7.0 10/21/2021   ALBUMIN 3.5 04/01/2018   CALCIUM 9.6 03/22/2023   GFRAA 100 08/14/2020    Speciality Comments: No specialty comments available.  Procedures:  No procedures performed Allergies: Patient has no known allergies.   Assessment / Plan:     Visit Diagnoses: Chronic pain of left knee - S/P Large Joint Inj: L knee on 01/22/2023 Doing well today, reports satisfactory improvement after injection in September. No appreciable effusion no pain on exam today.   Right knee pain Improvement in symptoms since the last injection in September. New onset of swelling in the right knee for the past two weeks without pain. Ultrasound showed minimal joint effusion and a bone spur on the patella. Discussed the benefits of strengthening exercises and the potential for another injection if symptoms worsen. -Continue with NSAIDs as needed for pain. -Initiate home exercise program focusing on quadriceps strengthening. -Monitor symptoms and consider another injection if symptoms worsen. -Follow-up as needed if symptoms flare up or change.    Orders: No orders of the defined types were placed in this encounter.  No orders of the defined  types were placed in this encounter.    Follow-Up Instructions: Return if symptoms worsen or fail to improve.   Fuller Plan, MD  Note - This record has been created using [employer].  Chart creation errors have been sought, but may not always  have been located. Such creation errors do not reflect on  the standard of medical care.

## 2023-04-05 ENCOUNTER — Ambulatory Visit: Payer: Federal, State, Local not specified - PPO | Admitting: Internal Medicine

## 2023-04-12 DIAGNOSIS — E1042 Type 1 diabetes mellitus with diabetic polyneuropathy: Secondary | ICD-10-CM | POA: Diagnosis not present

## 2023-04-12 DIAGNOSIS — E10649 Type 1 diabetes mellitus with hypoglycemia without coma: Secondary | ICD-10-CM | POA: Diagnosis not present

## 2023-04-12 LAB — HEMOGLOBIN A1C: Hemoglobin A1C: 7.2

## 2023-04-16 ENCOUNTER — Ambulatory Visit: Payer: Federal, State, Local not specified - PPO | Attending: Internal Medicine | Admitting: Internal Medicine

## 2023-04-16 ENCOUNTER — Encounter: Payer: Self-pay | Admitting: Internal Medicine

## 2023-04-16 VITALS — BP 128/75 | HR 92 | Resp 14 | Ht 69.0 in | Wt 253.0 lb

## 2023-04-16 DIAGNOSIS — D869 Sarcoidosis, unspecified: Secondary | ICD-10-CM | POA: Diagnosis not present

## 2023-04-16 DIAGNOSIS — G8929 Other chronic pain: Secondary | ICD-10-CM | POA: Diagnosis not present

## 2023-04-16 DIAGNOSIS — L409 Psoriasis, unspecified: Secondary | ICD-10-CM

## 2023-04-16 DIAGNOSIS — M25562 Pain in left knee: Secondary | ICD-10-CM

## 2023-04-23 ENCOUNTER — Other Ambulatory Visit: Payer: Self-pay | Admitting: Family Medicine

## 2023-04-23 DIAGNOSIS — E1159 Type 2 diabetes mellitus with other circulatory complications: Secondary | ICD-10-CM

## 2023-04-26 ENCOUNTER — Encounter: Payer: Self-pay | Admitting: Urology

## 2023-04-26 ENCOUNTER — Ambulatory Visit: Payer: Federal, State, Local not specified - PPO | Admitting: Urology

## 2023-04-26 VITALS — BP 132/83 | HR 101 | Ht 69.0 in | Wt 250.0 lb

## 2023-04-26 DIAGNOSIS — R350 Frequency of micturition: Secondary | ICD-10-CM | POA: Diagnosis not present

## 2023-04-26 DIAGNOSIS — R351 Nocturia: Secondary | ICD-10-CM

## 2023-04-26 LAB — MICROSCOPIC EXAMINATION

## 2023-04-26 LAB — URINALYSIS, ROUTINE W REFLEX MICROSCOPIC
Bilirubin, UA: NEGATIVE
Glucose, UA: NEGATIVE
Ketones, UA: NEGATIVE
Leukocytes,UA: NEGATIVE
Nitrite, UA: NEGATIVE
Specific Gravity, UA: >1.03 — ABNORMAL HIGH (ref 1.005–1.030)
Urobilinogen, Ur: 1 mg/dL (ref 0.2–1.0)
pH, UA: 6 (ref 5.0–7.5)

## 2023-04-26 NOTE — Progress Notes (Signed)
Assessment: 1. Nocturia   2. Urinary frequency     Plan: Continue DDAVP 0.2 mg nightly. BMP today Continue tamsulosin. Return to office in 3 months  Chief Complaint: Chief Complaint  Patient presents with   Nocturia    History of Present Illness:  Nicholas Hoover is a 62 y.o. male who is seen for further evaluation of lower urinary tract symptoms.  He has had symptoms for a number of years.  He reported daytime frequency, voiding every 2-3 hours.  He also has had nocturia voiding every 2 hours overnight.  He reported urgency with occasional urge incontinence.  He voids with a good stream.  No dysuria or gross hematuria.  No history of UTIs.  He was placed on tamsulosin in July 2024 and noted a slight improvement in his symptoms.  PSA 7/24:  2.2 with 18% free.  He has a history of a stroke x 2, diabetes, prior brain surgery for sarcoidosis with a VP shunt.  He reported problems with achieving and maintaining his erections since his brain surgery in 2005.  He has an occasional spontaneous erection.  He has previously tried oral medications with sildenafil and tadalafil without benefit. PVR = 0 ml. He was given a trial of Gemtesa 75 mg daily at his visit in 8/24. He continued with some frequency and nocturia x 3-5.  He did not see any improvement in his symptoms with the [facility].  His primary complaint continued to be nighttime urination. No area or gross hematuria. IPSS = 12.  24 hour voiding diary showed that his I&O's were well matched.  Nighttime urine production:  52%, 60%, 70% He was started on desmopressin 0.1 mg nightly. BMP from 02/01/23 showed a Na of 147.  At his visit in 11/24, he continued on DDAVP 0.1 mg nightly.  He continued to have nocturia 4-5 times.  No side effects from the medication.  He reported that his daytime frequency was not bothersome.  He also continued on tamsulosin. No dysuria or gross hematuria. IPSS = 18. His dose of DDAVP was increased to 0.2 mg  nightly in 11/24.  Repeat BMP from 03/22/2023 showed a sodium of 144.  He returns today for follow-up.  He continues on DDAVP 0.2 mg nightly and tamsulosin 0.4 mg daily.  He continues to have nocturia with associated urgency, currently 4 times per night.  No significant daytime frequency.  No dysuria or gross hematuria.  He is overall satisfied with his current voiding pattern. IPSS = 6 today.  Portions of the above documentation were copied from a prior visit for review purposes only.   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   Situational depression 04/01/2018   Stroke (HCC)    Type 1 diabetes mellitus with hyperlipidemia (HCC) 04/01/2018    Past Surgical History:  Past Surgical History:  Procedure Laterality Date   VENTRICULAR ATRIAL SHUNT Right 2006   Procedure done at [facility]    Allergies:  No Known Allergies  Family History:  Family History  Problem Relation Age of Onset   Cancer Mother    Lung cancer Mother  Heart attack Father    Diabetes Sister    Diabetes Brother    Diabetes Sister     Social History:  Social History   Tobacco Use   Smoking status: Never    Passive exposure: Never   Smokeless tobacco: Never  Vaping Use   Vaping status: Never Used  Substance Use Topics   Alcohol use: Not Currently   Drug use: Never    ROS: Constitutional:  Negative for fever, chills, weight loss CV: Negative for chest pain, previous MI, hypertension Respiratory:  Negative for shortness of breath, wheezing, sleep apnea, frequent cough GI:  Negative for nausea, vomiting, bloody stool,  GERD  Physical exam: BP 132/83   Pulse (!) 101   Ht 5\' 9"  (1.753 m)   Wt 250 lb (113.4 kg)   BMI 36.92 kg/m  GENERAL APPEARANCE:  Well appearing, well developed, well nourished, NAD HEENT:  Atraumatic, normocephalic, oropharynx clear NECK:  Supple without lymphadenopathy or thyromegaly ABDOMEN:  Soft, non-tender, no masses EXTREMITIES:  Moves all extremities well, without clubbing, cyanosis, or edema NEUROLOGIC:  Alert and oriented x 3, normal gait, CN II-XII grossly intact MENTAL STATUS:  appropriate BACK:  Non-tender to palpation, No CVAT SKIN:  Warm, dry, and intact   Results: U/A: 0-5 WBC, 0-2 RBC, 3+ protein

## 2023-04-27 LAB — BASIC METABOLIC PANEL
BUN/Creatinine Ratio: 23 (ref 10–24)
BUN: 24 mg/dL (ref 8–27)
CO2: 26 mmol/L (ref 20–29)
Calcium: 9.9 mg/dL (ref 8.6–10.2)
Chloride: 109 mmol/L — ABNORMAL HIGH (ref 96–106)
Creatinine, Ser: 1.03 mg/dL (ref 0.76–1.27)
Glucose: 157 mg/dL — ABNORMAL HIGH (ref 70–99)
Potassium: 4.7 mmol/L (ref 3.5–5.2)
Sodium: 149 mmol/L — ABNORMAL HIGH (ref 134–144)
eGFR: 83 mL/min/{1.73_m2} (ref >59)

## 2023-04-29 ENCOUNTER — Encounter: Payer: Self-pay | Admitting: Urology

## 2023-05-28 ENCOUNTER — Encounter: Payer: Self-pay | Admitting: Family Medicine

## 2023-05-28 ENCOUNTER — Ambulatory Visit (INDEPENDENT_AMBULATORY_CARE_PROVIDER_SITE_OTHER): Payer: Federal, State, Local not specified - PPO | Admitting: Family Medicine

## 2023-05-28 VITALS — BP 123/75 | HR 105 | Ht 69.0 in | Wt 249.0 lb

## 2023-05-28 DIAGNOSIS — E108 Type 1 diabetes mellitus with unspecified complications: Secondary | ICD-10-CM

## 2023-05-28 DIAGNOSIS — I152 Hypertension secondary to endocrine disorders: Secondary | ICD-10-CM

## 2023-05-28 DIAGNOSIS — E1159 Type 2 diabetes mellitus with other circulatory complications: Secondary | ICD-10-CM

## 2023-05-28 DIAGNOSIS — Z8673 Personal history of transient ischemic attack (TIA), and cerebral infarction without residual deficits: Secondary | ICD-10-CM | POA: Diagnosis not present

## 2023-05-28 DIAGNOSIS — E13319 Other specified diabetes mellitus with unspecified diabetic retinopathy without macular edema: Secondary | ICD-10-CM

## 2023-05-28 NOTE — Assessment & Plan Note (Signed)
Management per endocrinology.  Most recent A1c 7.2%.  Requesting eye exam from [facility].

## 2023-05-28 NOTE — Progress Notes (Signed)
Nicholas Hoover - 63 y.o. male MRN [id number]  Date of birth: Feb 10, 1961  Subjective Chief Complaint  Patient presents with   Medical Management of Chronic Issues    HPI Nicholas Hoover is a 63 y.o. male here today for follow up visit.   He reports that he is doing pretty well.    BP is well controlled.  Doing well with amlodipine, losartan and coreg.  No side effects at this time from medication.  He also remains on plavix due to history of CVA.  Lipids are managed with atorvastatin 80mg  and zetia.  Last LDL reviewed via care everywhere from 04/12/23 and was 61.    He continues to see endocrinology for management of T1 diabetes.  Currently on levemir with plan to change to Lantus.  Currently at 12 units qam and 5 units in the evening.  He is using novolog before meals as well.  Seeing [facility] for eye exam.   He is seeing urology for nocturia.  Tried tadalfil and flomax as well as Gemtesa.  Now on DDAVP.  Sodium has been stable.   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 04/01/2018   Situational depression 04/01/2018   Stroke (HCC)    Type 1 diabetes mellitus with hyperlipidemia (HCC) 04/01/2018    Past Surgical History:  Procedure Laterality Date   VENTRICULAR ATRIAL SHUNT Right 2006   Procedure done at [facility]    Social History   Socioeconomic History   Marital status: Married    Spouse name: Not on file   Number of children: Not on file   Years of education: Not on file   Highest education level: Not on file   Occupational History   Not on file  Tobacco Use   Smoking status: Never    Passive exposure: Never   Smokeless tobacco: Never  Vaping Use   Vaping status: Never Used  Substance and Sexual Activity   Alcohol use: Not Currently   Drug use: Never   Sexual activity: Not Currently  Other Topics Concern   Not on file  Social History Narrative   Not on file   Social Drivers of Health   Financial Resource Strain: Low Risk  (06/25/2022)   Received from [facility], [facility]   Overall Financial Resource Strain (CARDIA)    Difficulty of Paying Living Expenses: Not very hard  Food Insecurity: No Food Insecurity (06/25/2022)   Received from [facility], [facility]   Hunger Vital Sign    Worried About Running Out of Food in the Last Year: Never true    Ran Out of Food in the Last Year: Never true  Transportation Needs: No Transportation Needs (06/25/2022)   Received from [facility], [facility]   PRAPARE - Transportation    Lack of Transportation (Medical): No    Lack of Transportation (Non-Medical): No  Physical Activity: Not on file  Stress: Not on file  Social Connections: Unknown (09/08/2021)   Received from [facility], [facility]   Social Network  Social Network: Not on file    Family History  Problem Relation Age of Onset   Cancer Mother    Lung cancer Mother    Heart attack Father    Diabetes Sister    Diabetes Brother    Diabetes Sister     Health Maintenance  Topic Date Due   OPHTHALMOLOGY EXAM  Never done   INFLUENZA VACCINE  07/26/2023 (Originally 11/26/2022)   Colonoscopy  11/25/2023 (Originally 10/16/2022)   Hepatitis C Screening  11/25/2023 (Originally 10/15/1980)   COVID-19 Vaccine (4 - 2024-25 season) 11/26/2023 (Originally 12/27/2022)   Zoster Vaccines- Shingrix (1 of 2) 11/26/2023 (Originally 10/15/2012)   Pneumococcal Vaccine 74-67 Years old (2 of 2 - PCV) 05/27/2024 (Originally 04/07/2019)   Diabetic kidney evaluation - Urine ACR   10/06/2023   HEMOGLOBIN A1C  10/11/2023   FOOT EXAM  11/25/2023   Diabetic kidney evaluation - eGFR measurement  04/25/2024   DTaP/Tdap/Td (4 - Td or Tdap) 10/22/2031   HIV Screening  Completed   HPV VACCINES  Aged Out     ----------------------------------------------------------------------------------------------------------------------------------------------------------------------------------------------------------------- Physical Exam BP 123/75 (BP Location: Left Arm, Patient Position: Sitting, Cuff Size: Large)   Pulse (!) 105   Ht 5\' 9"  (1.753 m)   Wt 249 lb (112.9 kg)   SpO2 96%   BMI 36.77 kg/m   Physical Exam Constitutional:      Appearance: Normal appearance.  Eyes:     General: No scleral icterus. Cardiovascular:     Rate and Rhythm: Normal rate and regular rhythm.  Pulmonary:     Effort: Pulmonary effort is normal.     Breath sounds: Normal breath sounds.  Neurological:     Mental Status: He is alert.     Comments: Dysarthria and mild expressive aphasia noted.   Psychiatric:        Mood and Affect: Mood normal.        Behavior: Behavior normal.     ------------------------------------------------------------------------------------------------------------------------------------------------------------------------------------------------------------------- Assessment and Plan  Type 1 diabetes mellitus with complications (HCC) Management per endocrinology.  Most recent A1c 7.2%.  Requesting eye exam from [facility].   History of multiple strokes Remains on statin and plavix.  Recommend continuation.    Hypertension associated with diabetes (HCC) BP remains well controlled.  Continue current medications for management of HTN.  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    No orders of the defined types were placed in this encounter.   No follow-ups on file.    This visit occurred during the SARS-CoV-2  public health emergency.  Safety protocols were in place, including screening questions prior to the visit, additional usage of staff PPE, and extensive cleaning of exam room while observing appropriate contact time as indicated for disinfecting solutions.

## 2023-05-28 NOTE — Assessment & Plan Note (Signed)
 Followed by [facility]

## 2023-05-28 NOTE — Assessment & Plan Note (Signed)
 BP remains well controlled.  Continue current medications for management of HTN.

## 2023-05-28 NOTE — Assessment & Plan Note (Signed)
 Remains on statin and plavix.  Recommend continuation.

## 2023-09-30 ENCOUNTER — Telehealth: Payer: Self-pay

## 2023-09-30 NOTE — Telephone Encounter (Signed)
 Patient contacted the office stating he was using clobetasol  cream for his psoriasis and it is not working. Advised the patient his PCP was prescribing it. Patient states he know but it does not work. Patient inquires why Dr. Rodell Citrin never prescribed him anything. Patient would like something for his psoriasis. Please advise.

## 2023-10-01 NOTE — Telephone Encounter (Signed)
 Contacted the patient and advised Dr. Rodell Citrin recommends he should see a dermatologist if the skin rash is not improving with a high potency steroid and treatment with his primary care office.  Dr. Rodell Citrin reviewed the previous pathology that said possible psoriasis but did not rule out alternative causes as possible which might explain the lack of response.   We were seeing him for inflammatory arthritis particularly with knee pain that was treated with a steroid injection but had been doing well at our last visit.  Patient states he is going to see his primary care office again and will get them to place a referral.

## 2023-10-01 NOTE — Telephone Encounter (Signed)
 I recommend he should see a dermatologist if the skin rash is not improving with a high potency steroid and treatment with his primary care office.  I reviewed the previous pathology that said possible psoriasis but did not rule out alternative causes as possible which might explain the lack of response.  We were seeing him for inflammatory arthritis particularly with knee pain that was treated with a steroid injection but had been doing well at our last visit.

## 2023-10-05 ENCOUNTER — Other Ambulatory Visit: Payer: Self-pay

## 2023-10-05 MED ORDER — TAMSULOSIN HCL 0.4 MG PO CAPS: 0.4000 mg | ORAL_CAPSULE | Freq: Every day | ORAL | 5 refills | Status: DC

## 2023-10-07 ENCOUNTER — Telehealth: Payer: Self-pay | Admitting: Family Medicine

## 2023-10-07 DIAGNOSIS — L409 Psoriasis, unspecified: Secondary | ICD-10-CM

## 2023-10-07 NOTE — Telephone Encounter (Signed)
 Copied from CRM [id number]. Topic: Referral - Request for Referral >> Oct 07, 2023  9:05 AM Karole Pacer C wrote: Did the patient discuss referral with their provider in the last year? Yes (If No - schedule appointment) (If Yes - send message)  Appointment offered? No  Type of order/referral and detailed reason for visit: Dermatology to treat psoriasis  Preference of office, provider, location: No preference- recommendations needed   If referral order, have you been seen by this specialty before? Yes (If Yes, this issue or another issue? When? Where?  Can we respond through MyChart? Yes

## 2023-11-09 ENCOUNTER — Ambulatory Visit: Admitting: Urology

## 2023-11-09 ENCOUNTER — Encounter: Payer: Self-pay | Admitting: Urology

## 2023-11-09 ENCOUNTER — Other Ambulatory Visit: Payer: Self-pay | Admitting: Family Medicine

## 2023-11-09 VITALS — BP 128/78 | HR 97 | Ht 69.0 in | Wt 236.0 lb

## 2023-11-09 DIAGNOSIS — R351 Nocturia: Secondary | ICD-10-CM | POA: Diagnosis not present

## 2023-11-09 DIAGNOSIS — N138 Other obstructive and reflux uropathy: Secondary | ICD-10-CM | POA: Diagnosis not present

## 2023-11-09 DIAGNOSIS — E1159 Type 2 diabetes mellitus with other circulatory complications: Secondary | ICD-10-CM

## 2023-11-09 DIAGNOSIS — N401 Enlarged prostate with lower urinary tract symptoms: Secondary | ICD-10-CM

## 2023-11-09 LAB — URINALYSIS, ROUTINE W REFLEX MICROSCOPIC
Bilirubin, UA: NEGATIVE
Glucose, UA: NEGATIVE
Leukocytes,UA: NEGATIVE
Nitrite, UA: NEGATIVE
Specific Gravity, UA: >=1.03 (ref 1.005–1.030)
Urobilinogen, Ur: 1 mg/dL (ref 0.2–1.0)
pH, UA: 5.5 (ref 5.0–7.5)

## 2023-11-09 LAB — MICROSCOPIC EXAMINATION

## 2023-11-09 MED ORDER — LOSARTAN POTASSIUM 100 MG PO TABS: 100.0000 mg | ORAL_TABLET | Freq: Every day | ORAL | 0 refills | Status: DC

## 2023-11-09 NOTE — Telephone Encounter (Signed)
 Copied from CRM [id number]. Topic: Clinical - Medication Refill >> Nov 09, 2023 11:43 AM Nicholas Hoover ORN wrote: Medication: losartan  (COZAAR ) 100 MG tablet  Has the patient contacted their pharmacy? Yes, pharmacy called patient (Agent: If no, request that the patient contact the pharmacy for the refill. If patient does not wish to contact the pharmacy document the reason why and proceed with request.) (Agent: If yes, when and what did the pharmacy advise?)  This is the patient's preferred pharmacy:  [id number] - [place], [place] - [address] AT [facility] [address] Nicholas Hoover [address] Phone: [phone] Fax: [phone]  Is this the correct pharmacy for this prescription? Yes If no, delete pharmacy and type the correct one.   Has the prescription been filled recently? No  Is the patient out of the medication? Yes  Has the patient been seen for an appointment in the last year OR does the patient have an upcoming appointment? Yes  Can we respond through MyChart? Yes  Agent: Please be advised that Rx refills may take up to 3 business days. We ask that you follow-up with your pharmacy.

## 2023-11-09 NOTE — Progress Notes (Signed)
 Assessment: 1. Nocturia   2. BPH with obstruction/lower urinary tract symptoms     Plan: I discussed diagnosis and management of nocturia with the patient and his wife today.  I discussed the findings on his voiding diary to suggest nocturnal polyuria.  The role of DDAVP  and management of nocturnal polyuria discussed.  I also discussed the association of sleep apnea with nocturia and strongly recommended that he resume using his CPAP for management of his sleep apnea as this would likely decrease his nocturia. Continue DDAVP  0.2 mg nightly. PSA today Continue tamsulosin . Return to office in 3 months  Chief Complaint: Chief Complaint  Patient presents with   Nocturia    History of Present Illness:  Nicholas Hoover is a 63 y.o. male who is seen for further evaluation of lower urinary tract symptoms.  He has had symptoms for a number of years.  He reported daytime frequency, voiding every 2-3 hours.  He also has had nocturia voiding every 2 hours overnight.  He reported urgency with occasional urge incontinence.  He voids with a good stream.  No dysuria or gross hematuria.  No history of UTIs.  He was placed on tamsulosin  in July 2024 and noted a slight improvement in his symptoms.  PSA 7/24:  2.2 with 18% free.  He has a history of a stroke x 2, diabetes, prior brain surgery for sarcoidosis with a VP shunt.  He reported problems with achieving and maintaining his erections since his brain surgery in 2005.  He has an occasional spontaneous erection.  He has previously tried oral medications with sildenafil and tadalafil without benefit. PVR = 0 ml. He was given a trial of Gemtesa  75 mg daily at his visit in 8/24. He continued with some frequency and nocturia x 3-5.  He did not see any improvement in his symptoms with the Gemtesa .  His primary complaint continued to be nighttime urination. No area or gross hematuria. IPSS = 12.  24 hour voiding diary showed that his I&O's were well  matched.  Nighttime urine production:  52%, 60%, 70% He was started on desmopressin  0.1 mg nightly. BMP from 02/01/23 showed a Na of 147.  At his visit in 11/24, he continued on DDAVP  0.1 mg nightly.  He continued to have nocturia 4-5 times.  No side effects from the medication.  He reported that his daytime frequency was not bothersome.  He also continued on tamsulosin . No dysuria or gross hematuria. IPSS = 18. His dose of DDAVP  was increased to 0.2 mg nightly in 11/24.  Repeat BMP from 03/22/2023 showed a sodium of 144.  At his visit in December 2024, he continued on DDAVP  0.2 mg nightly and tamsulosin  0.4 mg daily.  He continued to have nocturia with associated urgency, 4 times per night.  No significant daytime frequency.  No dysuria or gross hematuria.  He was overall satisfied with his current voiding pattern. IPSS = 6. BMP from 12/24 showed a sodium of 149. BMP from 7/25 showed a sodium of 145.  He returns today for follow-up.  He continues on tamsulosin  0.4 mg daily and DDAVP  0.2 mg nightly. He continues with nocturia 3-4 times per night.  No daytime frequency.  He does have some urgency at night.  No dysuria or gross hematuria.  He has a diagnosis of sleep apnea but is not using a CPAP mask at the present time.  Portions of the above documentation were copied from a prior visit for review purposes only.  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   Situational depression 04/01/2018   Stroke (HCC)    Type 1 diabetes mellitus with hyperlipidemia (HCC) 04/01/2018    Past Surgical History:  Past Surgical History:  Procedure Laterality  Date   VENTRICULAR ATRIAL SHUNT Right 2006   Procedure done at [facility]    Allergies:  No Known Allergies  Family History:  Family History  Problem Relation Age of Onset   Cancer Mother    Lung cancer Mother    Heart attack Father    Diabetes Sister    Diabetes Brother    Diabetes Sister     Social History:  Social History   Tobacco Use   Smoking status: Never    Passive exposure: Never   Smokeless tobacco: Never  Vaping Use   Vaping status: Never Used  Substance Use Topics   Alcohol use: Not Currently   Drug use: Never    ROS: Constitutional:  Negative for fever, chills, weight loss CV: Negative for chest pain, previous MI, hypertension Respiratory:  Negative for shortness of breath, wheezing, sleep apnea, frequent cough GI:  Negative for nausea, vomiting, bloody stool, GERD  Physical exam: BP 128/78   Pulse 97   Ht 5' 9 (1.753 m)   Wt 236 lb (107 kg)   BMI 34.85 kg/m  GENERAL APPEARANCE:  Well appearing, well developed, well nourished, NAD HEENT:  Atraumatic, normocephalic, oropharynx clear NECK:  Supple without lymphadenopathy or thyromegaly ABDOMEN:  Soft, non-tender, no masses EXTREMITIES:  Moves all extremities well, without clubbing, cyanosis, or edema NEUROLOGIC:  Alert and oriented x 3, normal gait, CN II-XII grossly intact MENTAL STATUS:  appropriate BACK:  Non-tender to palpation, No CVAT SKIN:  Warm, dry, and intact GU: Prostate: 50 g, nontender, no nodules Rectum: Normal tone,  no masses or tenderness    Results: U/A: 0-5 WBCs, 0-2 RBCs

## 2023-11-10 ENCOUNTER — Ambulatory Visit: Payer: Self-pay | Admitting: Urology

## 2023-11-10 LAB — PSA: Prostate Specific Ag, Serum: 2.5 ng/mL (ref 0.0–4.0)

## 2023-11-25 ENCOUNTER — Ambulatory Visit: Payer: Federal, State, Local not specified - PPO | Admitting: Family Medicine

## 2023-11-25 LAB — HM DIABETES EYE EXAM

## 2023-12-13 ENCOUNTER — Ambulatory Visit: Admitting: Family Medicine

## 2024-01-01 ENCOUNTER — Other Ambulatory Visit: Payer: Self-pay | Admitting: Family Medicine

## 2024-02-08 ENCOUNTER — Telehealth: Payer: Self-pay | Admitting: Urology

## 2024-02-08 NOTE — Telephone Encounter (Signed)
 Patient called to find out if we take his insurance he states he no longer has BCBS but it is still showing active. He states he has medicaid but I do not have that information. Called back and lvm on 02/08/24.

## 2024-02-09 ENCOUNTER — Ambulatory Visit: Admitting: Urology

## 2024-02-14 ENCOUNTER — Other Ambulatory Visit: Payer: Self-pay | Admitting: Family Medicine

## 2024-02-14 DIAGNOSIS — E1159 Type 2 diabetes mellitus with other circulatory complications: Secondary | ICD-10-CM

## 2024-02-15 ENCOUNTER — Other Ambulatory Visit: Payer: Self-pay | Admitting: Family Medicine

## 2024-02-15 DIAGNOSIS — I152 Hypertension secondary to endocrine disorders: Secondary | ICD-10-CM

## 2024-02-15 NOTE — Telephone Encounter (Unsigned)
 Copied from CRM [id number]. Topic: Clinical - Medication Refill >> Feb 15, 2024  1:47 PM Nicholas Hoover wrote: Medication: ezetimibe  (ZETIA ) 10 MG tablet  Has the patient contacted their pharmacy? No (Agent: If no, request that the patient contact the pharmacy for the refill. If patient does not wish to contact the pharmacy document the reason why and proceed with request.) (Agent: If yes, when and what did the pharmacy advise?)  This is the patient's preferred pharmacy:  [facility] #[id number] - [place], [place] - [address] AT [facility] [address] [place] [address] Phone: [phone] Fax: [phone]  Is this the correct pharmacy for this prescription? Yes If no, delete pharmacy and type the correct one.   Has the prescription been filled recently? No  Is the patient out of the medication? Yes  Has the patient been seen for an appointment in the last year OR does the patient have an upcoming appointment? Yes  Can we respond through MyChart? yes  Agent: Please be advised that Rx refills may take up to 3 business days. We ask that you follow-up with your pharmacy.

## 2024-02-15 NOTE — Telephone Encounter (Unsigned)
 Copied from CRM #[id number]. Topic: Clinical - Medication Refill >> Feb 15, 2024  1:40 PM Winona R wrote: Medication:  amLODipine  (NORVASC ) 10 MG tablet   Has the patient contacted their pharmacy? Yes, contact provider (Agent: If no, request that the patient contact the pharmacy for the refill. If patient does not wish to contact the pharmacy document the reason why and proceed with request.) (Agent: If yes, when and what did the pharmacy advise?)  This is the patient's preferred pharmacy:  [facility] #[id number] - [place], [place] - [address] AT [facility] [address] [place] [address] Phone: [phone] Fax: [phone]  Is this the correct pharmacy for this prescription? Yes If no, delete pharmacy and type the correct one.   Has the prescription been filled recently? Yes  Is the patient out of the medication? Yes  Has the patient been seen for an appointment in the last year OR does the patient have an upcoming appointment? Yes  Can we respond through MyChart? Yes  Agent: Please be advised that Rx refills may take up to 3 business days. We ask that you follow-up with your pharmacy.

## 2024-02-17 ENCOUNTER — Other Ambulatory Visit: Payer: Self-pay | Admitting: Family Medicine

## 2024-02-17 DIAGNOSIS — E1159 Type 2 diabetes mellitus with other circulatory complications: Secondary | ICD-10-CM

## 2024-02-18 MED ORDER — EZETIMIBE 10 MG PO TABS: 10.0000 mg | ORAL_TABLET | Freq: Every day | ORAL | 1 refills | Status: DC

## 2024-02-18 NOTE — Telephone Encounter (Signed)
 Medication sent.

## 2024-03-15 ENCOUNTER — Other Ambulatory Visit: Payer: Self-pay | Admitting: Urology

## 2024-03-15 DIAGNOSIS — R351 Nocturia: Secondary | ICD-10-CM

## 2024-03-16 ENCOUNTER — Telehealth: Payer: Self-pay | Admitting: Urology

## 2024-03-16 NOTE — Telephone Encounter (Signed)
 Per Dr. Roseann he would like Nicholas Hoover to follow up with him sometime in Jan.2026. I called and left a message for the patient to return my call to schedule.  [name]

## 2024-03-19 ENCOUNTER — Other Ambulatory Visit: Payer: Self-pay | Admitting: Family Medicine

## 2024-03-19 DIAGNOSIS — I152 Hypertension secondary to endocrine disorders: Secondary | ICD-10-CM

## 2024-03-20 ENCOUNTER — Other Ambulatory Visit: Payer: Self-pay

## 2024-03-20 ENCOUNTER — Telehealth: Payer: Self-pay | Admitting: Urology

## 2024-03-20 MED ORDER — EZETIMIBE 10 MG PO TABS: 10.0000 mg | ORAL_TABLET | Freq: Every day | ORAL | 0 refills | Status: DC

## 2024-03-20 NOTE — Telephone Encounter (Signed)
 Pls contact pt to schedule appt with Dr. Alvia. Sending 15 day med refill. No additional refills without kept appt. Thx

## 2024-03-21 NOTE — Telephone Encounter (Signed)
 Does not have ins. Right now

## 2024-03-31 ENCOUNTER — Other Ambulatory Visit: Payer: Self-pay

## 2024-03-31 MED ORDER — ATORVASTATIN CALCIUM 80 MG PO TABS: ORAL_TABLET | ORAL | 0 refills | Status: DC

## 2024-04-01 ENCOUNTER — Other Ambulatory Visit: Payer: Self-pay | Admitting: Family Medicine

## 2024-04-01 DIAGNOSIS — I152 Hypertension secondary to endocrine disorders: Secondary | ICD-10-CM

## 2024-04-11 ENCOUNTER — Other Ambulatory Visit: Payer: Self-pay

## 2024-04-11 DIAGNOSIS — I152 Hypertension secondary to endocrine disorders: Secondary | ICD-10-CM

## 2024-04-11 MED ORDER — AMLODIPINE BESYLATE 10 MG PO TABS: 10.0000 mg | ORAL_TABLET | Freq: Every day | ORAL | 0 refills | Status: DC

## 2024-04-20 ENCOUNTER — Other Ambulatory Visit: Payer: Self-pay | Admitting: Family Medicine

## 2024-04-30 ENCOUNTER — Other Ambulatory Visit: Payer: Self-pay | Admitting: Family Medicine

## 2024-05-02 LAB — HEMOGLOBIN A1C: Hemoglobin A1C: 6.6

## 2024-05-09 ENCOUNTER — Encounter: Payer: Self-pay | Admitting: Family Medicine

## 2024-05-09 ENCOUNTER — Ambulatory Visit: Admitting: Family Medicine

## 2024-05-09 VITALS — BP 120/72 | HR 104 | Ht 69.0 in | Wt 237.0 lb

## 2024-05-09 DIAGNOSIS — I152 Hypertension secondary to endocrine disorders: Secondary | ICD-10-CM

## 2024-05-09 DIAGNOSIS — F4321 Adjustment disorder with depressed mood: Secondary | ICD-10-CM | POA: Diagnosis not present

## 2024-05-09 DIAGNOSIS — E108 Type 1 diabetes mellitus with unspecified complications: Secondary | ICD-10-CM

## 2024-05-09 DIAGNOSIS — Z8673 Personal history of transient ischemic attack (TIA), and cerebral infarction without residual deficits: Secondary | ICD-10-CM | POA: Diagnosis not present

## 2024-05-09 MED ORDER — CLOPIDOGREL BISULFATE 75 MG PO TABS: ORAL_TABLET | ORAL | 1 refills | Status: AC

## 2024-05-09 MED ORDER — CARVEDILOL 6.25 MG PO TABS: 6.2500 mg | ORAL_TABLET | Freq: Two times a day (BID) | ORAL | 3 refills | Status: AC

## 2024-05-09 MED ORDER — LOSARTAN POTASSIUM 100 MG PO TABS: 100.0000 mg | ORAL_TABLET | Freq: Every day | ORAL | 1 refills | Status: AC

## 2024-05-09 MED ORDER — ATORVASTATIN CALCIUM 80 MG PO TABS: ORAL_TABLET | ORAL | 1 refills | Status: AC

## 2024-05-09 MED ORDER — LOSARTAN POTASSIUM 100 MG PO TABS: 100.0000 mg | ORAL_TABLET | Freq: Every day | ORAL | 0 refills | Status: AC

## 2024-05-09 MED ORDER — AMLODIPINE BESYLATE 10 MG PO TABS: 10.0000 mg | ORAL_TABLET | Freq: Every day | ORAL | 1 refills | Status: AC

## 2024-05-09 MED ORDER — EZETIMIBE 10 MG PO TABS: 10.0000 mg | ORAL_TABLET | Freq: Every day | ORAL | 1 refills | Status: AC

## 2024-05-09 NOTE — Progress Notes (Signed)
 " Bird Swetz - 64 y.o. male MRN [id number]  Date of birth: 1960-08-13  Subjective Chief Complaint  Patient presents with   Hypertension    HPI Nicholas Hoover is 64 y.o.  male here today for follow up.   He remains on coreg , losartan  and amlodipine  for management of HTN.  BP is well controlled with current medications.  Denies side effects at current strength.  He denies chest pain, shortness of breath, palpitations, headache or vision changes.   He is followed by endocrinology for management of T1DM.  Remains on lantus  and novolog .  Most recent A1c of 6.6%.  No hypoglycemia.   History of mulitple cva's.  R sided weakness.  He remains on plavix  and statin.    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   Situational depression 04/01/2018   Stroke (HCC)    Type 1 diabetes mellitus with hyperlipidemia (HCC) 04/01/2018    Past Surgical History:  Procedure Laterality Date   VENTRICULAR ATRIAL SHUNT Right 2006   Procedure done at [facility]    Social History   Socioeconomic History   Marital status: Married    Spouse name: Not on file   Number of children: Not on file   Years of education: Not on file   Highest education level: Not on file  Occupational History   Not on file  Tobacco Use   Smoking status: Never    Passive exposure: Never   Smokeless tobacco: Never  Vaping Use   Vaping status: Never Used  Substance and Sexual Activity   Alcohol use: Not Currently   Drug use: Never   Sexual activity: Not Currently  Other Topics Concern    Not on file  Social History Narrative   Not on file   Social Drivers of Health   Tobacco Use: Low Risk (05/09/2024)   Patient History    Smoking Tobacco Use: Never    Smokeless Tobacco Use: Never    Passive Exposure: Never  Financial Resource Strain: Low Risk (07/20/2023)   Received from [facility]   Overall Financial Resource Strain (CARDIA)    Difficulty of Paying Living Expenses: Not hard at all  Food Insecurity: No Food Insecurity (07/20/2023)   Received from [facility]   Epic    Within the past 12 months, you worried that your food would run out before you got the money to buy more.: Never true    Within the past 12 months, the food you bought just didn't last and you didn't have money to get more.: Never true  Transportation Needs: No Transportation Needs (07/20/2023)   Received from [facility] - Transportation    Lack of Transportation (Medical): No    Lack of Transportation (Non-Medical): No  Physical Activity: Unknown (07/20/2023)   Received from [facility]   Exercise Vital Sign    On average, how many days per week do you engage in moderate to strenuous exercise (like a brisk walk)?: 0 days    Minutes of Exercise per Session: Not  on file  Stress: No Stress Concern Present (07/20/2023)   Received from [facility] of Occupational [facility] - Occupational Stress Questionnaire    Feeling of Stress : Not at all  Social Connections: Moderately Integrated (07/20/2023)   Received from [facility]   Social Network    How would you rate your social network (family, work, friends)?: Adequate participation with social networks  Depression (PHQ2-9): Medium Risk (05/09/2024)   Depression (PHQ2-9)    PHQ-2 Score: 5  Alcohol Screen: Not on file  Housing: Low Risk (07/20/2023)   Received from [facility]    In the last 12 months, was there a time when you were not able to pay the mortgage or rent on time?: No    In the past 12 months,  how many times have you moved where you were living?: 0    At any time in the past 12 months, were you homeless or living in a shelter (including now)?: No  Utilities: Not At Risk (07/20/2023)   Received from [facility] Utilities    Threatened with loss of utilities: No  Health Literacy: Not on file    Family History  Problem Relation Age of Onset   Cancer Mother    Lung cancer Mother    Heart attack Father    Diabetes Sister    Diabetes Brother    Diabetes Sister     Health Maintenance  Topic Date Due   Diabetic kidney evaluation - Urine ACR  Never done   Hepatitis C Screening  Never done   Colonoscopy  10/16/2022   COVID-19 Vaccine (4 - 2025-26 season) 12/27/2023   Diabetic kidney evaluation - eGFR measurement  04/25/2024   Influenza Vaccine  07/25/2024 (Originally 11/26/2023)   Zoster Vaccines- Shingrix (1 of 2) 08/07/2024 (Originally 10/15/2012)   Pneumococcal Vaccine: 50+ Years (2 of 2 - PCV) 05/09/2025 (Originally 04/07/2019)   HEMOGLOBIN A1C  10/30/2024   OPHTHALMOLOGY EXAM  11/24/2024   FOOT EXAM  05/09/2025   DTaP/Tdap/Td (4 - Td or Tdap) 10/22/2031   HIV Screening  Completed   Hepatitis B Vaccines 19-59 Average Risk  Aged Out   HPV VACCINES  Aged Out   Meningococcal B Vaccine  Aged Out     ----------------------------------------------------------------------------------------------------------------------------------------------------------------------------------------------------------------- Physical Exam BP 120/72 (BP Location: Left Arm, Patient Position: Sitting, Cuff Size: Normal)   Pulse (!) 104   Ht 5' 9 (1.753 m)   Wt 237 lb (107.5 kg)   SpO2 97%   BMI 35.00 kg/m   Physical Exam Constitutional:      Appearance: Normal appearance.  Eyes:     General: No scleral icterus. Cardiovascular:     Rate and Rhythm: Normal rate and regular rhythm.  Pulmonary:     Effort: Pulmonary effort is normal.     Breath sounds: Normal breath sounds.   Neurological:     Mental Status: He is alert.  Psychiatric:        Mood and Affect: Mood normal.     ------------------------------------------------------------------------------------------------------------------------------------------------------------------------------------------------------------------- Assessment and Plan  Hypertension associated with diabetes (HCC) BP remains well controlled.  Continue current medications for management of HTN.  Type 1 diabetes mellitus with complications [facility]) Management per endocrinology.  Most recent A1c 6.6%.  Requesting eye exam from [facility]   History of multiple strokes Remains on statin and plavix .  Risk factor reduction with statin and antihypertensives.  Diabetes is well controlled. Recommend continuation of current medications.  Situational depression Continue sertraline    Meds ordered this encounter  Medications   losartan  (COZAAR ) 100 MG tablet    Sig: Take 1 tablet (100 mg total) by mouth daily.    Dispense:  90 tablet    Refill:  1   losartan  (COZAAR ) 100 MG tablet    Sig: Take 1 tablet (100 mg total) by mouth daily.    Dispense:  30 tablet    Refill:  0    ZERO refills remain on this prescription. Your patient is requesting advance approval of refills for this medication to PREVENT ANY MISSED DOSES   amLODipine  (NORVASC ) 10 MG tablet    Sig: Take 1 tablet (10 mg total) by mouth daily.    Dispense:  90 tablet    Refill:  1   atorvastatin  (LIPITOR ) 80 MG tablet    Sig: TAKE 1 TABLET(80 MG) BY MOUTH DAILY    Dispense:  90 tablet    Refill:  1   carvedilol  (COREG ) 6.25 MG tablet    Sig: Take 1 tablet (6.25 mg total) by mouth 2 (two) times daily.    Dispense:  180 tablet    Refill:  3   clopidogrel  (PLAVIX ) 75 MG tablet    Sig: TAKE 1 TABLET(75 MG) BY MOUTH DAILY    Dispense:  90 tablet    Refill:  1   ezetimibe  (ZETIA ) 10 MG tablet    Sig: Take 1 tablet (10 mg total) by mouth daily. Needs appointment     Dispense:  90 tablet    Refill:  1    **Patient requests 90 days supply**    Return in about 6 months (around 11/06/2024) for Hypertension.        [1] No Known Allergies  "

## 2024-05-09 NOTE — Assessment & Plan Note (Signed)
 Continue sertraline 

## 2024-05-09 NOTE — Assessment & Plan Note (Signed)
 Remains on statin and plavix .  Risk factor reduction with statin and antihypertensives.  Diabetes is well controlled. Recommend continuation of current medications.

## 2024-05-09 NOTE — Assessment & Plan Note (Signed)
 Management per endocrinology.  Most recent A1c 6.6%.  Requesting eye exam from [facility].

## 2024-05-09 NOTE — Assessment & Plan Note (Signed)
 BP remains well controlled.  Continue current medications for management of HTN.

## 2024-05-10 LAB — CBC WITH DIFFERENTIAL/PLATELET
Basophils Absolute: 0 x10E3/uL (ref 0.0–0.2)
Basos: 1 %
EOS (ABSOLUTE): 0.1 x10E3/uL (ref 0.0–0.4)
Eos: 2 %
Hematocrit: 48.2 % (ref 37.5–51.0)
Hemoglobin: 15.6 g/dL (ref 13.0–17.7)
Immature Grans (Abs): 0 x10E3/uL (ref 0.0–0.1)
Immature Granulocytes: 0 %
Lymphocytes Absolute: 1.3 x10E3/uL (ref 0.7–3.1)
Lymphs: 27 %
MCH: 30.1 pg (ref 26.6–33.0)
MCHC: 32.4 g/dL (ref 31.5–35.7)
MCV: 93 fL (ref 79–97)
Monocytes Absolute: 0.4 x10E3/uL (ref 0.1–0.9)
Monocytes: 9 %
Neutrophils Absolute: 3.1 x10E3/uL (ref 1.4–7.0)
Neutrophils: 61 %
Platelets: 207 x10E3/uL (ref 150–450)
RBC: 5.18 x10E6/uL (ref 4.14–5.80)
RDW: 13.3 % (ref 11.6–15.4)
WBC: 5 x10E3/uL (ref 3.4–10.8)

## 2024-05-10 LAB — CMP14+EGFR
ALT: 16 IU/L (ref 0–44)
AST: 32 IU/L (ref 0–40)
Albumin: 4.1 g/dL (ref 3.9–4.9)
Alkaline Phosphatase: 102 IU/L (ref 47–123)
BUN/Creatinine Ratio: 22 (ref 10–24)
BUN: 24 mg/dL (ref 8–27)
Bilirubin Total: 0.4 mg/dL (ref 0.0–1.2)
CO2: 18 mmol/L — ABNORMAL LOW (ref 20–29)
Calcium: 9.4 mg/dL (ref 8.6–10.2)
Chloride: 106 mmol/L (ref 96–106)
Creatinine, Ser: 1.09 mg/dL (ref 0.76–1.27)
Globulin, Total: 3.4 g/dL (ref 1.5–4.5)
Glucose: 97 mg/dL (ref 70–99)
Sodium: 142 mmol/L (ref 134–144)
Total Protein: 7.5 g/dL (ref 6.0–8.5)
eGFR: 77 mL/min/1.73

## 2024-05-10 LAB — LIPID PANEL WITH LDL/HDL RATIO
Cholesterol, Total: 155 mg/dL (ref 100–199)
HDL: 72 mg/dL
LDL Chol Calc (NIH): 71 mg/dL (ref 0–99)
LDL/HDL Ratio: 1 ratio (ref 0.0–3.6)
Triglycerides: 57 mg/dL (ref 0–149)
VLDL Cholesterol Cal: 12 mg/dL (ref 5–40)

## 2024-05-12 ENCOUNTER — Other Ambulatory Visit: Payer: Self-pay | Admitting: Urology

## 2024-05-12 DIAGNOSIS — R351 Nocturia: Secondary | ICD-10-CM

## 2024-05-12 MED ORDER — DESMOPRESSIN ACETATE 0.2 MG PO TABS: 0.2000 mg | ORAL_TABLET | Freq: Every day | ORAL | 3 refills | Status: DC

## 2024-05-14 ENCOUNTER — Ambulatory Visit: Payer: Self-pay | Admitting: Family Medicine

## 2024-05-16 ENCOUNTER — Telehealth: Payer: Self-pay

## 2024-05-16 NOTE — Telephone Encounter (Signed)
-----   Message from [place] T sent at 05/16/2024 11:51 AM EST ----- Regarding: Rx send to [facility] Patient called and asked if his Rx could be sent over to [facility]

## 2024-05-16 NOTE — Telephone Encounter (Signed)
 Refill for desmopressin  0.2 MG tablet sent to [facility] mail order pharmacy on 05/12/24. Patient notified.

## 2024-05-17 ENCOUNTER — Other Ambulatory Visit: Payer: Self-pay

## 2024-05-17 DIAGNOSIS — R351 Nocturia: Secondary | ICD-10-CM

## 2024-05-17 MED ORDER — DESMOPRESSIN ACETATE 0.2 MG PO TABS: 0.2000 mg | ORAL_TABLET | Freq: Every day | ORAL | 3 refills | Status: AC

## 2024-05-23 ENCOUNTER — Ambulatory Visit: Admitting: Urology

## 2024-05-23 ENCOUNTER — Encounter: Payer: Self-pay | Admitting: Urology

## 2024-05-23 VITALS — BP 132/80 | HR 84 | Ht 69.0 in | Wt 230.0 lb

## 2024-05-23 DIAGNOSIS — N138 Other obstructive and reflux uropathy: Secondary | ICD-10-CM | POA: Diagnosis not present

## 2024-05-23 DIAGNOSIS — R351 Nocturia: Secondary | ICD-10-CM | POA: Diagnosis not present

## 2024-05-23 DIAGNOSIS — N401 Enlarged prostate with lower urinary tract symptoms: Secondary | ICD-10-CM

## 2024-05-23 LAB — URINALYSIS, ROUTINE W REFLEX MICROSCOPIC
Glucose, UA: NEGATIVE
Leukocytes,UA: NEGATIVE
Nitrite, UA: NEGATIVE
Urobilinogen, Ur: 1 mg/dL (ref 0.2–1.0)
pH, UA: 5.5 (ref 5.0–7.5)

## 2024-05-23 LAB — MICROSCOPIC EXAMINATION

## 2024-05-23 LAB — BLADDER SCAN AMB NON-IMAGING

## 2024-05-23 MED ORDER — TAMSULOSIN HCL 0.4 MG PO CAPS: 0.4000 mg | ORAL_CAPSULE | Freq: Every day | ORAL | 3 refills | Status: AC

## 2024-05-23 MED ORDER — SOLIFENACIN SUCCINATE 5 MG PO TABS: 5.0000 mg | ORAL_TABLET | Freq: Every day | ORAL | 11 refills | Status: AC

## 2024-05-23 NOTE — Progress Notes (Signed)
 "  Assessment: 1. Nocturia   2. BPH with obstruction/lower urinary tract symptoms     Plan: I again discussed the association of sleep apnea with nocturia and strongly recommended that he resume using his CPAP for management of his sleep apnea as this would likely improve his nocturia. Continue DDAVP  0.2 mg nightly. Continue tamsulosin . Trial of solifenacin  5 mg daily.  Rx sent. Return to office in 6 weeks. Repeat U/A next visit.  Discussed further evaluation if microscopic hematuria persists.  Chief Complaint: Chief Complaint  Patient presents with   Nocturia    History of Present Illness:  Nicholas Hoover is a 64 y.o. male who is seen for further evaluation of lower urinary tract symptoms.  He has had symptoms for a number of years.  He reported daytime frequency, voiding every 2-3 hours.  He also has had nocturia voiding every 2 hours overnight.  He reported urgency with occasional urge incontinence.  He voids with a good stream.  No dysuria or gross hematuria.  No history of UTIs.  He was placed on tamsulosin  in July 2024 and noted a slight improvement in his symptoms.  PSA 7/24:  2.2 with 18% free.  He has a history of a stroke x 2, diabetes, prior brain surgery for sarcoidosis with a VP shunt.  He reported problems with achieving and maintaining his erections since his brain surgery in 2005.  He has an occasional spontaneous erection.  He has previously tried oral medications with sildenafil and tadalafil without benefit. PVR = 0 ml. He was given a trial of Gemtesa  75 mg daily at his visit in 8/24. He continued with some frequency and nocturia x 3-5.  He did not see any improvement in his symptoms with the Gemtesa .  His primary complaint continued to be nighttime urination. No area or gross hematuria. IPSS = 12.  24 hour voiding diary showed that his I&O's were well matched.  Nighttime urine production:  52%, 60%, 70% He was started on desmopressin  0.1 mg nightly. BMP from  02/01/23 showed a Na of 147.  At his visit in 11/24, he continued on DDAVP  0.1 mg nightly.  He continued to have nocturia 4-5 times.  No side effects from the medication.  He reported that his daytime frequency was not bothersome.  He also continued on tamsulosin . No dysuria or gross hematuria. IPSS = 18. His dose of DDAVP  was increased to 0.2 mg nightly in 11/24.  Repeat BMP from 03/22/2023 showed a sodium of 144.  At his visit in December 2024, he continued on DDAVP  0.2 mg nightly and tamsulosin  0.4 mg daily.  He continued to have nocturia with associated urgency, 4 times per night.  No significant daytime frequency.  No dysuria or gross hematuria.  He was overall satisfied with his current voiding pattern. IPSS = 6. BMP from 12/24 showed a sodium of 149. BMP from 7/25 showed a sodium of 145.  At his visit in July 2025, he continued on tamsulosin  0.4 mg daily and DDAVP  0.2 mg nightly. He continued with nocturia 3-4 times per night.  No daytime frequency.  He reported some urgency at night.  No dysuria or gross hematuria.  He has a diagnosis of sleep apnea but was not using a CPAP mask. PSA 7/25: 2.5  He returns today for follow-up.  He continues on tamsulosin  0.4 mg daily and DDAVP  0.2 mg nightly.  He continues to have nocturia every 2 hours.  He also reports daytime frequency, voiding every 2  hours.  No dysuria or gross hematuria.  He is not using a CPAP mask for his diagnosed sleep apnea. IPSS = 19/4. Na from 1/26:  142   Portions of the above documentation were copied from a prior visit for review purposes only.   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   Obstructive sleep apnea    Psoriasis    Sarcoidosis 04/01/2018   Sarcoidosis of central nervous system: s/p VP  shunt 04/01/2018   Situational depression 04/01/2018   Stroke (HCC)    Type 1 diabetes mellitus with hyperlipidemia (HCC) 04/01/2018    Past Surgical History:  Past Surgical History:  Procedure Laterality Date   VENTRICULAR ATRIAL SHUNT Right 2006   Procedure done at [facility]    Allergies:  No Known Allergies  Family History:  Family History  Problem Relation Age of Onset   Cancer Mother    Lung cancer Mother    Heart attack Father    Diabetes Sister    Diabetes Brother    Diabetes Sister     Social History:  Social History   Tobacco Use   Smoking status: Never    Passive exposure: Never   Smokeless tobacco: Never  Vaping Use   Vaping status: Never Used  Substance Use Topics   Alcohol use: Not Currently   Drug use: Never    ROS: Constitutional:  Negative for fever, chills, weight loss CV: Negative for chest pain, previous MI, hypertension Respiratory:  Negative for shortness of breath, wheezing, sleep apnea, frequent cough GI:  Negative for nausea, vomiting, bloody stool, GERD  Physical exam: BP 132/80   Pulse 84   Ht 5' 9 (1.753 m)   Wt 230 lb (104.3 kg)   BMI 33.97 kg/m  GENERAL APPEARANCE:  Well appearing, well developed, well nourished, NAD HEENT:  Atraumatic, normocephalic, oropharynx clear NECK:  Supple without lymphadenopathy or thyromegaly ABDOMEN:  Soft, non-tender, no masses EXTREMITIES:  Moves all extremities well, without clubbing, cyanosis, or edema NEUROLOGIC:  Alert and oriented x 3, normal gait, CN II-XII grossly intact MENTAL STATUS:  appropriate BACK:  Non-tender to palpation, No CVAT SKIN:  Warm, dry, and intact   Results: U/A: 0-5 WBCs, 3-10 RBCs  PVR = 45 ml "

## 2024-07-06 ENCOUNTER — Ambulatory Visit: Admitting: Urology

## 2024-11-06 ENCOUNTER — Ambulatory Visit: Admitting: Family Medicine
